# Patient Record
Sex: Male | Born: 1990 | Race: White | Hispanic: No | Marital: Single | Smoking: Never smoker
Health system: Southern US, Community
[De-identification: ages and names within clinical notes are randomized; demographics above are authoritative.]

## PROBLEM LIST (undated history)

## (undated) DIAGNOSIS — R45851 Suicidal ideations: Secondary | ICD-10-CM

## (undated) DIAGNOSIS — F329 Major depressive disorder, single episode, unspecified: Secondary | ICD-10-CM

## (undated) DIAGNOSIS — F32A Depression, unspecified: Secondary | ICD-10-CM

---
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
## 2013-09-09 ENCOUNTER — Emergency Department: Payer: Self-pay | Admitting: Emergency Medicine

## 2013-09-09 LAB — CBC
MCH: 31.2 pg (ref 26.0–34.0)
MCHC: 36 g/dL (ref 32.0–36.0)
MCV: 87 fL (ref 80–100)
Platelet: 191 10*3/uL (ref 150–440)

## 2013-09-09 LAB — URINALYSIS, COMPLETE
Bilirubin,UR: NEGATIVE
Leukocyte Esterase: NEGATIVE
Ph: 5 (ref 4.5–8.0)
Protein: NEGATIVE
WBC UR: 1 /HPF (ref 0–5)

## 2013-09-09 LAB — T4, FREE: Free Thyroxine: 1 ng/dL (ref 0.76–1.46)

## 2013-09-09 LAB — DRUG SCREEN, URINE
Barbiturates, Ur Screen: NEGATIVE (ref ?–200)
MDMA (Ecstasy)Ur Screen: NEGATIVE (ref ?–500)
Tricyclic, Ur Screen: NEGATIVE (ref ?–1000)

## 2013-09-09 LAB — COMPREHENSIVE METABOLIC PANEL
Chloride: 106 mmol/L (ref 98–107)
Creatinine: 1.12 mg/dL (ref 0.60–1.30)
EGFR (Non-African Amer.): >60
Glucose: 109 mg/dL — ABNORMAL HIGH (ref 65–99)
Osmolality: 274 (ref 275–301)

## 2013-09-09 LAB — TSH: Thyroid Stimulating Horm: 5.68 u[IU]/mL — ABNORMAL HIGH

## 2013-12-12 ENCOUNTER — Emergency Department: Payer: Self-pay | Admitting: Emergency Medicine

## 2013-12-12 LAB — URINALYSIS, COMPLETE
BILIRUBIN, UR: NEGATIVE
Bacteria: NONE SEEN
Blood: NEGATIVE
GLUCOSE, UR: NEGATIVE mg/dL (ref 0–75)
Ketone: NEGATIVE
LEUKOCYTE ESTERASE: NEGATIVE
Nitrite: NEGATIVE
PROTEIN: NEGATIVE
Ph: 6 (ref 4.5–8.0)
SPECIFIC GRAVITY: 1.024 (ref 1.003–1.030)
Squamous Epithelial: NONE SEEN
WBC UR: 1 /HPF (ref 0–5)

## 2013-12-12 LAB — COMPREHENSIVE METABOLIC PANEL
ALBUMIN: 3.9 g/dL (ref 3.4–5.0)
AST: 29 U/L (ref 15–37)
Alkaline Phosphatase: 103 U/L
Anion Gap: 5 — ABNORMAL LOW (ref 7–16)
BUN: 11 mg/dL (ref 7–18)
Bilirubin,Total: 0.4 mg/dL (ref 0.2–1.0)
CHLORIDE: 105 mmol/L (ref 98–107)
CO2: 29 mmol/L (ref 21–32)
CREATININE: 0.92 mg/dL (ref 0.60–1.30)
Calcium, Total: 9.1 mg/dL (ref 8.5–10.1)
EGFR (African American): >60
GLUCOSE: 117 mg/dL — AB (ref 65–99)
OSMOLALITY: 278 (ref 275–301)
POTASSIUM: 3.7 mmol/L (ref 3.5–5.1)
SGPT (ALT): 46 U/L (ref 12–78)
Sodium: 139 mmol/L (ref 136–145)
Total Protein: 7.3 g/dL (ref 6.4–8.2)

## 2013-12-12 LAB — DRUG SCREEN, URINE
AMPHETAMINES, UR SCREEN: NEGATIVE (ref ?–1000)
BARBITURATES, UR SCREEN: NEGATIVE (ref ?–200)
BENZODIAZEPINE, UR SCRN: NEGATIVE (ref ?–200)
CANNABINOID 50 NG, UR ~~LOC~~: NEGATIVE (ref ?–50)
COCAINE METABOLITE, UR ~~LOC~~: NEGATIVE (ref ?–300)
MDMA (Ecstasy)Ur Screen: NEGATIVE (ref ?–500)
METHADONE, UR SCREEN: NEGATIVE (ref ?–300)
Opiate, Ur Screen: NEGATIVE (ref ?–300)
Phencyclidine (PCP) Ur S: NEGATIVE (ref ?–25)
TRICYCLIC, UR SCREEN: NEGATIVE (ref ?–1000)

## 2013-12-12 LAB — ETHANOL: Ethanol %: <0.003 % (ref 0.000–0.080)

## 2013-12-12 LAB — CBC
HCT: 46.4 % (ref 40.0–52.0)
HGB: 16.4 g/dL (ref 13.0–18.0)
MCH: 30.7 pg (ref 26.0–34.0)
MCHC: 35.4 g/dL (ref 32.0–36.0)
MCV: 87 fL (ref 80–100)
Platelet: 183 10*3/uL (ref 150–440)
RBC: 5.34 10*6/uL (ref 4.40–5.90)
RDW: 13.6 % (ref 11.5–14.5)
WBC: 5.7 10*3/uL (ref 3.8–10.6)

## 2013-12-12 LAB — ACETAMINOPHEN LEVEL: Acetaminophen: <2 ug/mL

## 2013-12-12 LAB — SALICYLATE LEVEL: Salicylates, Serum: <1.7 mg/dL

## 2014-07-28 ENCOUNTER — Emergency Department: Payer: Self-pay

## 2014-07-28 LAB — COMPREHENSIVE METABOLIC PANEL
ALBUMIN: 4.2 g/dL (ref 3.4–5.0)
ALK PHOS: 128 U/L — AB
Anion Gap: 8 (ref 7–16)
BILIRUBIN TOTAL: 0.7 mg/dL (ref 0.2–1.0)
BUN: 10 mg/dL (ref 7–18)
CALCIUM: 9.3 mg/dL (ref 8.5–10.1)
CHLORIDE: 104 mmol/L (ref 98–107)
CO2: 26 mmol/L (ref 21–32)
CREATININE: 1.22 mg/dL (ref 0.60–1.30)
GLUCOSE: 94 mg/dL (ref 65–99)
OSMOLALITY: 274 (ref 275–301)
POTASSIUM: 4 mmol/L (ref 3.5–5.1)
SGOT(AST): 37 U/L (ref 15–37)
SGPT (ALT): 48 U/L
Sodium: 138 mmol/L (ref 136–145)
TOTAL PROTEIN: 8.4 g/dL — AB (ref 6.4–8.2)

## 2014-07-28 LAB — CBC
HCT: 53.9 % — AB (ref 40.0–52.0)
HGB: 18.5 g/dL — ABNORMAL HIGH (ref 13.0–18.0)
MCH: 30 pg (ref 26.0–34.0)
MCHC: 34.2 g/dL (ref 32.0–36.0)
MCV: 88 fL (ref 80–100)
Platelet: 284 10*3/uL (ref 150–440)
RBC: 6.14 10*6/uL — ABNORMAL HIGH (ref 4.40–5.90)
RDW: 13.8 % (ref 11.5–14.5)
WBC: 8.7 10*3/uL (ref 3.8–10.6)

## 2014-07-28 LAB — URINALYSIS, COMPLETE
BLOOD: NEGATIVE
Bacteria: NONE SEEN
Bilirubin,UR: NEGATIVE
GLUCOSE, UR: NEGATIVE mg/dL (ref 0–75)
Hyaline Cast: 6
KETONE: NEGATIVE
LEUKOCYTE ESTERASE: NEGATIVE
NITRITE: NEGATIVE
PH: 5 (ref 4.5–8.0)
Protein: NEGATIVE
RBC,UR: 1 /HPF (ref 0–5)
Specific Gravity: 1.017 (ref 1.003–1.030)

## 2014-07-28 LAB — DRUG SCREEN, URINE

## 2014-07-28 LAB — ACETAMINOPHEN LEVEL

## 2014-07-28 LAB — TSH: THYROID STIMULATING HORM: 4.09 u[IU]/mL

## 2014-07-28 LAB — ETHANOL: Ethanol: <3 mg/dL

## 2014-07-28 LAB — SALICYLATE LEVEL: Salicylates, Serum: <1.7 mg/dL

## 2014-08-15 ENCOUNTER — Emergency Department: Payer: Self-pay | Admitting: Emergency Medicine

## 2014-08-15 LAB — COMPREHENSIVE METABOLIC PANEL
ALK PHOS: 115 U/L
ALT: 55 U/L
Albumin: 4.1 g/dL (ref 3.4–5.0)
BUN: 14 mg/dL (ref 7–18)
Bilirubin,Total: 0.4 mg/dL (ref 0.2–1.0)
CALCIUM: 8.9 mg/dL (ref 8.5–10.1)
Chloride: 111 mmol/L — ABNORMAL HIGH (ref 98–107)
Co2: 27 mmol/L (ref 21–32)
Creatinine: 1.13 mg/dL (ref 0.60–1.30)
EGFR (African American): >60
GLUCOSE: 101 mg/dL — AB (ref 65–99)
Osmolality: 273 (ref 275–301)
POTASSIUM: 3.7 mmol/L (ref 3.5–5.1)
SGOT(AST): 31 U/L (ref 15–37)
Sodium: 136 mmol/L (ref 136–145)
TOTAL PROTEIN: 7.8 g/dL (ref 6.4–8.2)

## 2014-08-15 LAB — ETHANOL: Ethanol: <3 mg/dL

## 2014-08-15 LAB — URINALYSIS, COMPLETE
BACTERIA: NONE SEEN
Bilirubin,UR: NEGATIVE
Blood: NEGATIVE
GLUCOSE, UR: NEGATIVE mg/dL (ref 0–75)
Leukocyte Esterase: NEGATIVE
NITRITE: NEGATIVE
Ph: 6 (ref 4.5–8.0)
Protein: 30
Specific Gravity: 1.035 (ref 1.003–1.030)
Squamous Epithelial: NONE SEEN

## 2014-08-15 LAB — SALICYLATE LEVEL: Salicylates, Serum: <1.7 mg/dL

## 2014-08-15 LAB — ACETAMINOPHEN LEVEL: Acetaminophen: <2 ug/mL

## 2014-08-15 LAB — CBC
HCT: 47.6 % (ref 40.0–52.0)
HGB: 16.7 g/dL (ref 13.0–18.0)
MCH: 30.8 pg (ref 26.0–34.0)
MCHC: 35 g/dL (ref 32.0–36.0)
MCV: 88 fL (ref 80–100)
Platelet: 211 10*3/uL (ref 150–440)
RBC: 5.41 10*6/uL (ref 4.40–5.90)
RDW: 13.8 % (ref 11.5–14.5)
WBC: 6.1 10*3/uL (ref 3.8–10.6)

## 2014-08-15 LAB — DRUG SCREEN, URINE

## 2014-10-22 ENCOUNTER — Emergency Department: Payer: Self-pay | Admitting: Emergency Medicine

## 2014-10-22 LAB — CBC WITH DIFFERENTIAL/PLATELET
BASOS ABS: 0.1 10*3/uL (ref 0.0–0.1)
Basophil %: 0.6 %
EOS ABS: 0.2 10*3/uL (ref 0.0–0.7)
EOS PCT: 2.3 %
HCT: 54.4 % — ABNORMAL HIGH (ref 40.0–52.0)
HGB: 18.3 g/dL — AB (ref 13.0–18.0)
LYMPHS ABS: 2.3 10*3/uL (ref 1.0–3.6)
Lymphocyte %: 24.4 %
MCH: 29.9 pg (ref 26.0–34.0)
MCHC: 33.6 g/dL (ref 32.0–36.0)
MCV: 89 fL (ref 80–100)
MONOS PCT: 7.4 %
Monocyte #: 0.7 x10 3/mm (ref 0.2–1.0)
NEUTROS ABS: 6.1 10*3/uL (ref 1.4–6.5)
Neutrophil %: 65.3 %
Platelet: 233 10*3/uL (ref 150–440)
RBC: 6.1 10*6/uL — AB (ref 4.40–5.90)
RDW: 13.9 % (ref 11.5–14.5)
WBC: 9.3 10*3/uL (ref 3.8–10.6)

## 2014-10-23 LAB — COMPREHENSIVE METABOLIC PANEL
ALBUMIN: 4.5 g/dL (ref 3.4–5.0)
ALK PHOS: 129 U/L — AB
ANION GAP: 7 (ref 7–16)
AST: 24 U/L (ref 15–37)
BILIRUBIN TOTAL: 0.6 mg/dL (ref 0.2–1.0)
BUN: 7 mg/dL (ref 7–18)
CALCIUM: 9.3 mg/dL (ref 8.5–10.1)
Chloride: 104 mmol/L (ref 98–107)
Co2: 29 mmol/L (ref 21–32)
Creatinine: 1.09 mg/dL (ref 0.60–1.30)
EGFR (African American): >60
GLUCOSE: 89 mg/dL (ref 65–99)
OSMOLALITY: 277 (ref 275–301)
POTASSIUM: 3.6 mmol/L (ref 3.5–5.1)
SGPT (ALT): 68 U/L — ABNORMAL HIGH
Sodium: 140 mmol/L (ref 136–145)
Total Protein: 8 g/dL (ref 6.4–8.2)

## 2014-10-23 LAB — URINALYSIS, COMPLETE
BLOOD: NEGATIVE
Bacteria: NONE SEEN
Bilirubin,UR: NEGATIVE
Glucose,UR: NEGATIVE mg/dL (ref 0–75)
Ketone: NEGATIVE
Leukocyte Esterase: NEGATIVE
Nitrite: NEGATIVE
PH: 5 (ref 4.5–8.0)
Protein: 30
SPECIFIC GRAVITY: 1.028 (ref 1.003–1.030)
SQUAMOUS EPITHELIAL: NONE SEEN

## 2014-10-23 LAB — LIPASE, BLOOD: LIPASE: 106 U/L (ref 73–393)

## 2015-03-02 NOTE — Consult Note (Signed)
PATIENT NAME:  Roger Miranda, Roger Miranda MR#:  [id number] DATE OF BIRTH:  [birth year]  DATE OF CONSULTATION:  08/16/2014  REFERRING PHYSICIAN:   CONSULTING PHYSICIAN:  Audery AmelJohn T. Clapacs, MD  IDENTIFYING INFORMATION AND REASON FOR CONSULT: A 24 year old gentleman brought here for evaluation after making what was thought to be a suicidal statement outside the hospital.   CHIEF COMPLAINT: "I had a panic attack."   HISTORY OF PRESENT ILLNESS: Information obtained from the patient and the chart. The patient's history that he is giving now is consistent with what he reported last night. He says that yesterday afternoon or evening, he had a panic attack. He was out on the street near the homeless shelter. He had just gone to the shelter to try and get admitted for the night and was told that he was too late. He realized that he had no place else to stay and started to feel completely overwhelmed. He says that the worst of the panic attack lasted about 5 minutes. Heart was beating fast, felt short of breath, felt overwhelmingly frightened. During that time, he called 911. By the time they got there, he had calmed down, but they diagnosed him with a panic attack. He says he has been under a lot of stress for the last couple of weeks. He and his mother seem to constantly butt heads and it escalated to where she threw him out of the house. He has no place to stay and no place to work. Feels like he has no support. He denies that he is having any suicidal or homicidal ideation. Denies that he is having any hallucinations. Denies that he is abusing substances. Not currently getting any mental health treatment.   PAST PSYCHIATRIC HISTORY: The patient says he has been in foster care and group homes ever since he was a young child and has seen psychiatrists many times over the years. He says they tried many medicines on him when he was a child, including everything from Ritalin to antipsychotics, to antidepressants. To his recollection,  none of it was any help and he stopped it as soon as he could. Has not been on any medication in 3 to 4 years. Has not been getting any mental health treatment since then either. He did go to a hospital psychiatrically, probably a little over a year ago. From the way he describes it, it sounds like [facility]. He either truly cannot remember it or just does not want to discuss it because he says he cannot remember why he was there and that he did not take any medicine. He denies history of suicide attempts. It does sound like he gets agitated and violent and has gotten in fights with people.   SUBSTANCE ABUSE HISTORY: Denies any drug abuse. Does say that he drinks occasionally, but he says it has never been a significant problem for him, was not drinking yesterday.   FAMILY HISTORY: He has a grandmother, whom he charitably describes as "insane."   SOCIAL HISTORY: The patient had been staying with his mother, but something in their relationship escalated to where she threw him out of the house. He has no place to live. He had not been to the shelter before and was going to try and check in and discovered he was too late which made him panic. No job. Did finish high school. He is trying to find work. Does not feel like he has any support.   CURRENT MEDICATIONS: None.  ALLERGIES: No known drug allergies.   REVIEW OF SYSTEMS: Denies any physical symptoms. No malaise and no pain. No fever. No weakness, no sleep problems. Denies suicidal or homicidal ideation. Full 9 category review of systems is negative.   MENTAL STATUS EXAMINATION: Reasonably well-groomed young man, who looks his stated age, cooperative with the interview. Eye contact good. Psychomotor activity normal. Speech normal rate, tone and volume. Affect is mildly anxious, but reactive and appropriate. Mood is stated as okay. Thoughts are lucid without loosening of associations. No sign of delusions. Denies auditory or visual  hallucinations. Denies suicidal or homicidal ideation. Can recall 3/3 objects immediately and at 3 minutes. Long-term memory intact. Judgment and insight adequate. Baseline intelligence and fund of knowledge appears normal.   LABORATORY RESULTS: Chemistry panel: Nothing remarkable. Drug screen is all negative. Alcohol level negative. CBC: Completely normal. Urinalysis: Normal.   PHYSICAL EXAMINATION:  VITAL SIGNS: Blood pressure 148/80, respirations 20, pulse 94, temperature 97.8.  The patient is ambulatory. Moves all extremities. Does not appear in any physical distress.   ASSESSMENT: A 24 year old man with an acute panic attack. It sounds like he may have had a lot of behavioral problems as a child, but as an adult, it is not clear from his history that he has any ongoing specific mental health diagnosis. Can be given a diagnosis of adjustment disorder with acute anxiety, but needs further evaluation. Does not require hospital level of treatment.   TREATMENT PLAN: The patient can be discharged from the Emergency Room. No medication will be started. He will be given a referral to [facility]. Supportive and educational counseling conducted about the utility of going for follow-up treatment and he is agreeable to the plan.   DIAGNOSIS, PRINCIPAL AND PRIMARY:  AXIS I: Adjustment disorder with anxiety.   SECONDARY DIAGNOSES:  AXIS I: No further.  AXIS II: Deferred.  AXIS III: No diagnosis.   ____________________________ Audery Amel, MD jtc:JT D: 08/16/2014 11:37:22 ET T: 08/16/2014 11:52:11 ET JOB#: [id number]  cc: Audery Amel, MD, <Dictator> Audery Amel MD ELECTRONICALLY SIGNED 08/20/2014 0:22

## 2015-04-09 ENCOUNTER — Encounter: Payer: Self-pay | Admitting: Urgent Care

## 2015-04-09 ENCOUNTER — Emergency Department
Admission: EM | Admit: 2015-04-09 | Discharge: 2015-04-10 | Disposition: A | Discharge status: Home / Self Care | Payer: Self-pay | Attending: Emergency Medicine | Admitting: Emergency Medicine

## 2015-04-09 DIAGNOSIS — F329 Major depressive disorder, single episode, unspecified: Secondary | ICD-10-CM | POA: Insufficient documentation

## 2015-04-09 DIAGNOSIS — F32A Depression, unspecified: Secondary | ICD-10-CM

## 2015-04-09 DIAGNOSIS — R45851 Suicidal ideations: Secondary | ICD-10-CM

## 2015-04-09 HISTORY — DX: Depression, unspecified: F32.A

## 2015-04-09 HISTORY — DX: Major depressive disorder, single episode, unspecified: F32.9

## 2015-04-09 LAB — URINALYSIS COMPLETE WITH MICROSCOPIC (ARMC ONLY)
BACTERIA UA: NONE SEEN
Bilirubin Urine: NEGATIVE
GLUCOSE, UA: NEGATIVE mg/dL
Hgb urine dipstick: NEGATIVE
Leukocytes, UA: NEGATIVE
Nitrite: NEGATIVE
Protein, ur: NEGATIVE mg/dL
SQUAMOUS EPITHELIAL / LPF: NONE SEEN
Specific Gravity, Urine: 1.023 (ref 1.005–1.030)
pH: 6 (ref 5.0–8.0)

## 2015-04-09 LAB — URINE DRUG SCREEN, QUALITATIVE (ARMC ONLY)
AMPHETAMINES, UR SCREEN: NOT DETECTED
Barbiturates, Ur Screen: NOT DETECTED
Benzodiazepine, Ur Scrn: NOT DETECTED
Cannabinoid 50 Ng, Ur ~~LOC~~: NOT DETECTED
Cocaine Metabolite,Ur ~~LOC~~: NOT DETECTED
MDMA (ECSTASY) UR SCREEN: NOT DETECTED
Methadone Scn, Ur: NOT DETECTED
Opiate, Ur Screen: NOT DETECTED
Phencyclidine (PCP) Ur S: NOT DETECTED
TRICYCLIC, UR SCREEN: NOT DETECTED

## 2015-04-09 LAB — COMPREHENSIVE METABOLIC PANEL
ALBUMIN: 4.4 g/dL (ref 3.5–5.0)
ALT: 34 U/L (ref 17–63)
AST: 35 U/L (ref 15–41)
Alkaline Phosphatase: 93 U/L (ref 38–126)
Anion gap: 9 (ref 5–15)
BUN: 14 mg/dL (ref 6–20)
CO2: 25 mmol/L (ref 22–32)
Calcium: 9.4 mg/dL (ref 8.9–10.3)
Chloride: 105 mmol/L (ref 101–111)
Creatinine, Ser: 1.09 mg/dL (ref 0.61–1.24)
GFR calc non Af Amer: >60 mL/min (ref >60)
Glucose, Bld: 88 mg/dL (ref 65–99)
POTASSIUM: 3.9 mmol/L (ref 3.5–5.1)
Sodium: 139 mmol/L (ref 135–145)
Total Bilirubin: 0.7 mg/dL (ref 0.3–1.2)
Total Protein: 7.5 g/dL (ref 6.5–8.1)

## 2015-04-09 LAB — CBC
HEMATOCRIT: 46.3 % (ref 40.0–52.0)
HEMOGLOBIN: 16.4 g/dL (ref 13.0–18.0)
MCH: 30.6 pg (ref 26.0–34.0)
MCHC: 35.3 g/dL (ref 32.0–36.0)
MCV: 86.6 fL (ref 80.0–100.0)
Platelets: 186 10*3/uL (ref 150–440)
RBC: 5.34 MIL/uL (ref 4.40–5.90)
RDW: 13.3 % (ref 11.5–14.5)
WBC: 5.3 10*3/uL (ref 3.8–10.6)

## 2015-04-09 LAB — ETHANOL: Alcohol, Ethyl (B): <5 mg/dL (ref <5)

## 2015-04-09 LAB — ACETAMINOPHEN LEVEL

## 2015-04-09 LAB — SALICYLATE LEVEL

## 2015-04-09 NOTE — ED Notes (Signed)
BEHAVIORAL HEALTH ROUNDING Patient sleeping: No. Patient alert and oriented: yes Behavior appropriate: Yes.  ; If no, describe:  Nutrition and fluids offered: Yes  Toileting and hygiene offered: Yes  Sitter present: yes Law enforcement present: Yes  

## 2015-04-09 NOTE — ED Notes (Signed)
Patient presents in custody of [employer]; VOLUNTARY at this time. Patient reports severe depression and SI; "its the same old trouble with my family. They push me to the point where I want to do something stupid." Patient reports that he text a "law firm" yesterday expressing SI citing that "no amount of money would fix it. It would be better if I just died." Patient denies SI and HI at this time.

## 2015-04-09 NOTE — ED Notes (Signed)

## 2015-04-10 DIAGNOSIS — F339 Major depressive disorder, recurrent, unspecified: Secondary | ICD-10-CM

## 2015-04-10 NOTE — ED Notes (Signed)

## 2015-04-10 NOTE — ED Notes (Signed)
Pt's mother called Mrs. Alona BeneJoyce Depolo to speak to pt. Dr. Manson PasseyBrown talked to pt's mother over the phone with pt's consent. Dr. Manson PasseyBrown provided information about the plan of care.

## 2015-04-10 NOTE — ED Notes (Signed)
No am meds ordered at this time  

## 2015-04-10 NOTE — ED Notes (Signed)

## 2015-04-10 NOTE — ED Notes (Signed)
BEHAVIORAL HEALTH ROUNDING Patient sleeping: No. Patient alert and oriented: yes Behavior appropriate: Yes.  ; If no, describe:  Nutrition and fluids offered: yes Toileting and hygiene offered: Yes  Sitter present: q15 minute observations and security camera monitoring Law enforcement present: Yes  ODS  

## 2015-04-10 NOTE — Consult Note (Signed)
[facility] Face-to-Face Psychiatry Consult   Reason for Consult:  Consult for this 24 year old man with history of recurrent depression who made alleging suicidal statements Referring Physician:  gayle Patient Identification: Roger Miranda MRN:  [id number] Principal Diagnosis: <principal problem not specified> Diagnosis:  There are no active problems to display for this patient.   Total Time spent with patient: 1 hour  Subjective:   Roger Miranda is a 24 y.o. male patient admitted with "2 days ago I was not feeling good" area patient admits that he posted some suicidal statements online when he was upset. He denies that he was actually having any intention or plan of killing himself.Marland Kitchen  HPI:  Information from the patient and the chart. Patient admits that a couple days ago he was online and he posted some statements about killing himself. He says he was feeling very bad and disappointed in his life and down on himself at the time. He denies that he actually had any intention of harming himself and denies that he had any plan to harm himself. Mood is intermittently depressed. Doesn't feel constantly down but is easily brought down into a negative space by thinking about his problems in his life. Has chronic poor sleep. Appetite normal. He's not currently getting any outpatient psychiatric treatment. He has been able to find work and is frustrated with his inability to make any progress in life. Denies any psychotic symptoms. Denies any substance abuse. Had been arguing with his uncle recently which may have been a stress that made him feel even worse about himself.  Past psychiatric history goes back to childhood. Multiple psychiatric medications have been tried at various times including stimulants and mood stabilizers and antidepressives. He hasn't taken medicines in years any consistent way. Unclear about whether he was ever of any benefit. He has been admitted to inpatient hospitals twice in the past. I have  seen him at least once previously when he had threatened suicide but had not required hospitalization.  Social history: Difficult life growing up. Has been homeless and separated from his family for long periods of time. Moved around the country staying at homeless shelters. Currently living with his mother but it sounds like it's not a very stable situation.  Medical history: Overweight. No other significant ongoing medical problems  Substance abuse history: Drinks rarely. Denies any abuse of drugs.  Current medications none HPI Elements:   Quality:  Suicidal threats. Severity:  Moderate. Timing:  A couple days ago when he was feeling depressed although thoughts are intermittent. Duration:  Transient. Context:  Homelessness lack of work conflict with family.  Past Medical History:  Past Medical History  Diagnosis Date  . Depression    History reviewed. No pertinent past surgical history. Family History: No family history on file. Social History:  History  Alcohol Use  . Yes     History  Drug Use No    History   Social History  . Marital Status: Single    Spouse Name: N/A  . Number of Children: N/A  . Years of Education: N/A   Social History Main Topics  . Smoking status: Never Smoker   . Smokeless tobacco: Not on file  . Alcohol Use: Yes  . Drug Use: No  . Sexual Activity: Not on file   Other Topics Concern  . None   Social History Narrative  . None   Additional Social History:    History of alcohol / drug use?: No history of alcohol /  drug abuse                     Allergies:  No Known Allergies  Labs:  Results for orders placed or performed during the hospital encounter of 04/09/15 (from the past 48 hour(s))  Acetaminophen level     Status: Abnormal   Collection Time: 04/09/15 10:25 PM  Result Value Ref Range   Acetaminophen (Tylenol), Serum <10 (L) 10 - 30 ug/mL    Comment:        THERAPEUTIC CONCENTRATIONS VARY SIGNIFICANTLY. A RANGE OF  10-30 ug/mL MAY BE AN EFFECTIVE CONCENTRATION FOR MANY PATIENTS. HOWEVER, SOME ARE BEST TREATED AT CONCENTRATIONS OUTSIDE THIS RANGE. ACETAMINOPHEN CONCENTRATIONS >150 ug/mL AT 4 HOURS AFTER INGESTION AND >50 ug/mL AT 12 HOURS AFTER INGESTION ARE OFTEN ASSOCIATED WITH TOXIC REACTIONS.   CBC     Status: None   Collection Time: 04/09/15 10:25 PM  Result Value Ref Range   WBC 5.3 3.8 - 10.6 K/uL   RBC 5.34 4.40 - 5.90 MIL/uL   Hemoglobin 16.4 13.0 - 18.0 g/dL   HCT 46.3 40.0 - 52.0 %   MCV 86.6 80.0 - 100.0 fL   MCH 30.6 26.0 - 34.0 pg   MCHC 35.3 32.0 - 36.0 g/dL   RDW 13.3 11.5 - 14.5 %   Platelets 186 150 - 440 K/uL  Comprehensive metabolic panel     Status: None   Collection Time: 04/09/15 10:25 PM  Result Value Ref Range   Sodium 139 135 - 145 mmol/L   Potassium 3.9 3.5 - 5.1 mmol/L   Chloride 105 101 - 111 mmol/L   CO2 25 22 - 32 mmol/L   Glucose, Bld 88 65 - 99 mg/dL   BUN 14 6 - 20 mg/dL   Creatinine, Ser 1.09 0.61 - 1.24 mg/dL   Calcium 9.4 8.9 - 10.3 mg/dL   Total Protein 7.5 6.5 - 8.1 g/dL   Albumin 4.4 3.5 - 5.0 g/dL   AST 35 15 - 41 U/L   ALT 34 17 - 63 U/L   Alkaline Phosphatase 93 38 - 126 U/L   Total Bilirubin 0.7 0.3 - 1.2 mg/dL   GFR calc non Af Amer >60 >60 mL/min   GFR calc Af Amer >60 >60 mL/min    Comment: (NOTE) The eGFR has been calculated using the CKD EPI equation. This calculation has not been validated in all clinical situations. eGFR's persistently <60 mL/min signify possible Chronic Kidney Disease.    Anion gap 9 5 - 15  Ethanol (ETOH)     Status: None   Collection Time: 04/09/15 10:25 PM  Result Value Ref Range   Alcohol, Ethyl (B) <5 <5 mg/dL    Comment:        LOWEST DETECTABLE LIMIT FOR SERUM ALCOHOL IS 11 mg/dL FOR MEDICAL PURPOSES ONLY   Salicylate level     Status: None   Collection Time: 04/09/15 10:25 PM  Result Value Ref Range   Salicylate Lvl <8.1 2.8 - 30.0 mg/dL  Urine Drug Screen, Qualitative [facility])     Status:  None   Collection Time: 04/09/15 10:25 PM  Result Value Ref Range   Tricyclic, Ur Screen NONE DETECTED NONE DETECTED   Amphetamines, Ur Screen NONE DETECTED NONE DETECTED   MDMA (Ecstasy)Ur Screen NONE DETECTED NONE DETECTED   Cocaine Metabolite,Ur [place] NONE DETECTED NONE DETECTED   Opiate, Ur Screen NONE DETECTED NONE DETECTED   Phencyclidine (PCP) Ur S NONE DETECTED NONE DETECTED  Cannabinoid 50 Ng, Ur [place] NONE DETECTED NONE DETECTED   Barbiturates, Ur Screen NONE DETECTED NONE DETECTED   Benzodiazepine, Ur Scrn NONE DETECTED NONE DETECTED   Methadone Scn, Ur NONE DETECTED NONE DETECTED    Comment: (NOTE) 379  Tricyclics, urine               Cutoff 1000 ng/mL 200  Amphetamines, urine             Cutoff 1000 ng/mL 300  MDMA (Ecstasy), urine           Cutoff 500 ng/mL 400  Cocaine Metabolite, urine       Cutoff 300 ng/mL 500  Opiate, urine                   Cutoff 300 ng/mL 600  Phencyclidine (PCP), urine      Cutoff 25 ng/mL 700  Cannabinoid, urine              Cutoff 50 ng/mL 800  Barbiturates, urine             Cutoff 200 ng/mL 900  Benzodiazepine, urine           Cutoff 200 ng/mL 1000 Methadone, urine                Cutoff 300 ng/mL 1100 1200 The urine drug screen provides only a preliminary, unconfirmed 1300 analytical test result and should not be used for non-medical 1400 purposes. Clinical consideration and professional judgment should 1500 be applied to any positive drug screen result due to possible 1600 interfering substances. A more specific alternate chemical method 1700 must be used in order to obtain a confirmed analytical result.  1800 Gas chromato graphy / mass spectrometry (GC/MS) is the preferred 1900 confirmatory method.   Urinalysis complete, with microscopic [facility])     Status: Abnormal   Collection Time: 04/09/15 10:25 PM  Result Value Ref Range   Color, Urine YELLOW (A) YELLOW   APPearance CLEAR (A) CLEAR   Glucose, UA NEGATIVE NEGATIVE mg/dL   Bilirubin  Urine NEGATIVE NEGATIVE   Ketones, ur 2+ (A) NEGATIVE mg/dL   Specific Gravity, Urine 1.023 1.005 - 1.030   Hgb urine dipstick NEGATIVE NEGATIVE   pH 6.0 5.0 - 8.0   Protein, ur NEGATIVE NEGATIVE mg/dL   Nitrite NEGATIVE NEGATIVE   Leukocytes, UA NEGATIVE NEGATIVE   RBC / HPF 0-5 0 - 5 RBC/hpf   WBC, UA 0-5 0 - 5 WBC/hpf   Bacteria, UA NONE SEEN NONE SEEN   Squamous Epithelial / LPF NONE SEEN NONE SEEN   Mucous PRESENT     Vitals: Blood pressure 132/88, pulse 93, temperature 98.6 F (37 C), temperature source Oral, resp. rate 18, height 5' 11"  (1.803 m), weight 127.007 kg (280 lb), SpO2 99 %.  Risk to Self: Suicidal Ideation: No-Not Currently/Within Last 6 Months Suicidal Intent: No Is patient at risk for suicide?: Yes Suicidal Plan?: No Access to Means:  (Unknown) What has been your use of drugs/alcohol within the last 12 months?: None How many times?: 0 Other Self Harm Risks: None reported Triggers for Past Attempts: None known Intentional Self Injurious Behavior: None Risk to Others: Homicidal Ideation: No Thoughts of Harm to Others: No Current Homicidal Intent: No Current Homicidal Plan: No Access to Homicidal Means: No Identified Victim: None reported History of harm to others?: No Assessment of Violence: On admission Violent Behavior Description: Denied Does patient have access to weapons?: No Criminal Charges Pending?: No  Does patient have a court date: No Prior Inpatient Therapy:   Prior Outpatient Therapy:    No current facility-administered medications for this encounter.   No current outpatient prescriptions on file.    Musculoskeletal: Strength & Muscle Tone: within normal limits Gait & Station: normal Patient leans: N/A  Psychiatric Specialty Exam: Physical Exam  Constitutional: He appears well-developed and well-nourished.  HENT:  Head: Normocephalic and atraumatic.  Eyes: Conjunctivae are normal. Pupils are equal, round, and reactive to light.   Neck: Normal range of motion.  Cardiovascular: Normal heart sounds.   Respiratory: Effort normal.  GI: Soft.  Musculoskeletal: Normal range of motion.  Neurological: He is alert.  Skin: Skin is warm and dry.  Psychiatric: His speech is normal and behavior is normal. Thought content normal. His mood appears anxious. Cognition and memory are normal. He expresses impulsivity.    Review of Systems  Constitutional: Negative.   HENT: Negative.   Eyes: Negative.   Respiratory: Negative.   Cardiovascular: Negative.   Gastrointestinal: Negative.   Musculoskeletal: Negative.   Skin: Negative.   Neurological: Negative.   Psychiatric/Behavioral: Positive for depression. Negative for suicidal ideas, hallucinations, memory loss and substance abuse. The patient is nervous/anxious and has insomnia.     Blood pressure 132/88, pulse 93, temperature 98.6 F (37 C), temperature source Oral, resp. rate 18, height 5' 11"  (1.803 m), weight 127.007 kg (280 lb), SpO2 99 %.Body mass index is 39.07 kg/(m^2).  General Appearance: Casual and Fairly Groomed  Engineer, water::  Good  Speech:  Clear and Coherent  Volume:  Normal  Mood:  Anxious  Affect:  Appropriate  Thought Process:  Negative  Orientation:  Full (Time, Place, and Person)  Thought Content:  Negative  Suicidal Thoughts:  No  Homicidal Thoughts:  No  Memory:  Immediate;   Good Recent;   Good Remote;   Good  Judgement:  Fair  Insight:  Fair  Psychomotor Activity:  Normal  Concentration:  Fair  Recall:  [place] of Knowledge:Good  Language: Good  Akathisia:  No  Handed:  Right  AIMS (if indicated):     Assets:  Communication Skills Desire for Improvement Physical Health Resilience  ADL's:  Intact  Cognition: WNL  Sleep:      Medical Decision Making: Review of Psycho-Social Stressors (1), Review or order clinical lab tests (1), Established Problem, Worsening (2), Review of Last Therapy Session (1) and Review of Medication Regimen &  Side Effects (2)  Treatment Plan Summary: Plan Patient currently denies any suicidal ideation. His affect is appropriate and upbeat. He is able to discuss positive things in his life and has a positive plan for the future. At this point he no longer appears to be an acute suicide risk and no longer requires inpatient hospital treatment. Patient and I discussed his need for outpatient psychiatric treatment. He will be referred to [facility] and encouraged to follow up there and get an evaluation for possible therapy or medication management. Patient agrees to the plan. Case discussed with emergency room doctor. Patient can be released from IVC and released from the emergency room at the discretion of the ER physician.  Plan:  No evidence of imminent risk to self or others at present.   Patient does not meet criteria for psychiatric inpatient admission. Discussed crisis plan, support from social network, calling 911, coming to the Emergency Department, and calling Suicide Hotline. Disposition: Release as noted above  Alethia Berthold 04/10/2015 4:21 PM

## 2015-04-10 NOTE — ED Provider Notes (Signed)
-----------------------------------------   10:12 AM on 04/10/2015 -----------------------------------------   BP 132/88 mmHg  Pulse 86  Temp(Src) 98.9 F (37.2 C) (Oral)  Resp 18  Ht 5\' 11"  (1.803 m)  Wt 280 lb (127.007 kg)  BMI 39.07 kg/m2  SpO2 99%  The patient had no acute events since last update.  Calm and cooperative at this time.  Disposition is pending per Psychiatry/Behavioral Medicine team recommendations.    Gayla DossEryka A Naisha Wisdom, MD 04/10/15 1012

## 2015-04-10 NOTE — ED Notes (Signed)

## 2015-04-10 NOTE — ED Notes (Signed)
Patient observed lying in bed with eyes closed  Even, unlabored respirations observed   NAD pt appears to be sleeping  I will continue to monitor along with every 15 minute visual observations and ongoing security camera monitoring    

## 2015-04-10 NOTE — ED Notes (Signed)
BEHAVIORAL HEALTH ROUNDING Patient sleeping: Yes.   Patient alert and oriented: eyes closed  Appears asleep Behavior appropriate: Yes.  ; If no, describe:  Nutrition and fluids offered: Yes  Toileting and hygiene offered: sleeping Sitter present: q 15 minute observations and security camera monitoring Law enforcement present: yes  ODS 

## 2015-04-10 NOTE — ED Notes (Signed)
Pt laying in bed.  

## 2015-04-10 NOTE — ED Notes (Signed)

## 2015-04-10 NOTE — ED Notes (Signed)
Supper provided  Pt observed with no unusual behavior  Appropriate to stimulation  No verbalized needs or concerns at this time  NAD assessed  Continue to monitor 

## 2015-04-10 NOTE — ED Notes (Signed)
Pt calm and cooperative at this time with no complaints noted, will continue to monitor, pending psych consult.

## 2015-04-10 NOTE — ED Notes (Addendum)
Pt is currently in the shower  Pt states  "I will brush my teeth when I get home cause I am not even supposed to be here."   Psych consult pending  Plan of care discussed   Appropriate to stimulation  No verbalized needs or concerns at this time  NAD assessed  Continue to monitor

## 2015-04-10 NOTE — ED Notes (Signed)
Breakfast provided   Patient observed lying in bed with eyes closed  Even, unlabored respirations observed   NAD pt appears to be sleeping  I will continue to monitor along with every 15 minute visual observations and ongoing security camera monitoring    

## 2015-04-10 NOTE — ED Provider Notes (Signed)
-----------------------------------------   4:13 PM on 04/10/2015 -----------------------------------------  Vital signs stable and unremarkable. Patient calm and cooperative. Discussed with Dr. Toni Amendlapacs from psychiatry in the ED after his evaluation. The patient is currently medically and psychiatrically stable. We will discharge him home and have him follow-up with [facility] for continued evaluation of his depression.  Clinical impression is depression  Sharman CheekPhillip Beaumont Austad, MD 04/10/15 [phone]

## 2015-04-10 NOTE — ED Notes (Signed)

## 2015-04-10 NOTE — ED Notes (Signed)

## 2015-04-10 NOTE — ED Notes (Signed)
Pt report given to Raquel D RN. Pt transferred to ED BHU without incident accompanied by this RN.
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
## 2015-04-10 NOTE — ED Notes (Signed)
[facility]. Placed in room and oriented to unit. Pt. informed that for their safety all care areas are designed for safety and monitored by security cameras at all times; and visiting hours explained to patient. Patient verbalizes understanding, and verbal contract for safety obtained.

## 2015-04-10 NOTE — ED Notes (Signed)
Patient observed with no unusual behavior or acute distress. Patient with no verbalized needs or c/o at this time.... will continue to monitor and follow up as needed. Security staff monitoring patient on [employer] system.  

## 2015-04-10 NOTE — ED Notes (Signed)
1/1 bags of belongings returned to the pt at this time   Discharge instructions reviewed with him and he verbalized agreement and understanding  [facility]

## 2015-04-10 NOTE — ED Notes (Signed)
Pt laying in bed no change in condition will continue to monitor  

## 2015-04-10 NOTE — Discharge Instructions (Signed)
Depression °Depression refers to feeling sad, low, down in the dumps, blue, gloomy, or empty. In general, there are two kinds of depression: °· Normal sadness or normal grief. This kind of depression is one that we all feel from time to time after upsetting life experiences, such as the loss of a job or the ending of a relationship. This kind of depression is considered normal, is short lived, and resolves within a few days to 2 weeks. Depression experienced after the loss of a loved one (bereavement) often lasts longer than 2 weeks but normally gets better with time. °· Clinical depression. This kind of depression lasts longer than normal sadness or normal grief or interferes with your ability to function at home, at work, and in school. It also interferes with your personal relationships. It affects almost every aspect of your life. Clinical depression is an illness. °Symptoms of depression can also be caused by conditions other than those mentioned above, such as: °· Physical illness. Some physical illnesses, including underactive thyroid gland (hypothyroidism), severe anemia, specific types of cancer, diabetes, uncontrolled seizures, heart and lung problems, strokes, and chronic pain are commonly associated with symptoms of depression. °· Side effects of some prescription medicine. In some people, certain types of medicine can cause symptoms of depression. °· Substance abuse. Abuse of alcohol and illicit drugs can cause symptoms of depression. °SYMPTOMS °Symptoms of normal sadness and normal grief include the following: °· Feeling sad or crying for short periods of time. °· Not caring about anything (apathy). °· Difficulty sleeping or sleeping too much. °· No longer able to enjoy the things you used to enjoy. °· Desire to be by oneself all the time (social isolation). °· Lack of energy or motivation. °· Difficulty concentrating or remembering. °· Change in appetite or weight. °· Restlessness or  agitation. °Symptoms of clinical depression include the same symptoms of normal sadness or normal grief and also the following symptoms: °· Feeling sad or crying all the time. °· Feelings of guilt or worthlessness. °· Feelings of hopelessness or helplessness. °· Thoughts of suicide or the desire to harm yourself (suicidal ideation). °· Loss of touch with reality (psychotic symptoms). Seeing or hearing things that are not real (hallucinations) or having false beliefs about your life or the people around you (delusions and paranoia). °DIAGNOSIS  °The diagnosis of clinical depression is usually based on how bad the symptoms are and how long they have lasted. Your health care provider will also ask you questions about your medical history and substance use to find out if physical illness, use of prescription medicine, or substance abuse is causing your depression. Your health care provider may also order blood tests. °TREATMENT  °Often, normal sadness and normal grief do not require treatment. However, sometimes antidepressant medicine is given for bereavement to ease the depressive symptoms until they resolve. °The treatment for clinical depression depends on how bad the symptoms are but often includes antidepressant medicine, counseling with a mental health professional, or both. Your health care provider will help to determine what treatment is best for you. °Depression caused by physical illness usually goes away with appropriate medical treatment of the illness. If prescription medicine is causing depression, talk with your health care provider about stopping the medicine, decreasing the dose, or changing to another medicine. °Depression caused by the abuse of alcohol or illicit drugs goes away when you stop using these substances. Some adults need professional help in order to stop drinking or using drugs. °SEEK IMMEDIATE MEDICAL   CARE IF: °· You have thoughts about hurting yourself or others. °· You lose touch  with reality (have psychotic symptoms). °· You are taking medicine for depression and have a serious side effect. °FOR MORE INFORMATION °· National Alliance on Mental Illness: www.nami.org  °· National Institute of Mental Health: www.nimh.nih.gov  °Document Released: 10/23/2000 Document Revised: 03/12/2014 Document Reviewed: 01/25/2012 °ExitCare® Patient Information ©2015 [employer]. This information is not intended to replace advice given to you by your health care provider. Make sure you discuss any questions you have with your health care provider. ° °Suicidal Feelings, How to Help Yourself °Everyone feels sad or unhappy at times, but depressing thoughts and feelings of hopelessness can lead to thoughts of suicide. It can seem as if life is too tough to handle. If you feel as though you have reached the point where suicide is the only answer, it is time to let someone know immediately.  °HOW TO COPE AND PREVENT SUICIDE °· Let family, friends, teachers, or counselors know. Get help. Try not to isolate yourself from those who care about you. Even though you may not feel sociable, talk with someone every day. It is best if it is face-to-face. Remember, they will want to help you. °· Eat a regularly spaced and well-balanced diet. °· Get plenty of rest. °· Avoid alcohol and drugs because they will only make you feel worse and may also lower your inhibitions. Remove them from the home. If you are thinking of taking an overdose of your prescribed medicines, give your medicines to someone who can give them to you one day at a time. If you are on antidepressants, let your caregiver know of your feelings so he or she can provide a safer medicine, if that is a concern. °· Remove weapons or poisons from your home. °· Try to stick to routines. Follow a schedule and remind yourself that you have to keep that schedule every day. °· Set some realistic goals and achieve them. Make a list and cross things off as you go.  Accomplishments give a sense of worth. Wait until you are feeling better before doing things you find difficult or unpleasant to do. °· If you are able, try to start exercising. Even half-hour periods of exercise each day will make you feel better. Getting out in the sun or into nature helps you recover from depression faster. If you have a favorite place to walk, take advantage of that. °· Increase safe activities that have always given you pleasure. This may include playing your favorite music, reading a good book, painting a picture, or playing your favorite instrument. Do whatever takes your mind off your depression. °· Keep your living space well-lighted. °GET HELP °Contact a suicide hotline, crisis center, or local suicide prevention center for help right away. Local centers may include a hospital, clinic, community service organization, social service provider, or health department. °· Call your local emergency services (911 in the [place]). °· Call a suicide hotline: °¨ 1-800-273-TALK (1-800-273-8255) in the [place]. °¨ 1-800-SUICIDE (1-800-784-2433) in the [place]. °¨ 1-888-628-9454 in the [place] for Spanish-speaking counselors. °¨ 1-800-799-4TTY (1-800-799-4889) in the [place] for TTY users. °· Visit the following websites for information and help: °¨ National Suicide Prevention Lifeline: www.suicidepreventionlifeline.org °¨ Hopeline: www.hopeline.com °¨ American Foundation for Suicide Prevention: www.afsp.org °· For lesbian, gay, bisexual, transgender, or questioning youth, contact The [employer]: °¨ 1-866-4-U-TREVOR (1-866-488-7386) in the [place]. °¨ www.thetrevorproject.org °· In [place], treatment resources are listed in each   province with listings available under [employer] or similar titles. Another source for Crisis Centres by Province is located at  http://www.suicideprevention.ca/in-crisis-now/find-a-crisis-centre-now/crisis-centres °Document Released: 05/02/2003 Document Revised: 01/18/2012 Document Reviewed: 02/20/2014 °ExitCare® Patient Information ©2015 [employer]. This information is not intended to replace advice given to you by your health care provider. Make sure you discuss any questions you have with your health care provider. ° °

## 2015-04-10 NOTE — ED Notes (Signed)
Lunch provided   Patient observed lying in bed with eyes closed  Even, unlabored respirations observed   NAD pt appears to be sleeping  I will continue to monitor along with every 15 minute visual observations and ongoing security camera monitoring

## 2015-04-10 NOTE — ED Provider Notes (Signed)
[facility] Emergency Department Provider Note  ____________________________________________  Time seen: 11:45 PM  I have reviewed the triage vital signs and the nursing notes.   HISTORY  Chief Complaint Depression; Suicidal; and Psychiatric Evaluation     HPI Roger Miranda is a 24 y.o. male presents with thin police custody. Patient admits to severe depression and suicidal ideation yesterday. Patient apparently per history pulses on Facebook a prior intent to kill himself. Patient currently admits to feeling incredibly depressed due to his inability to have a job.    Past Medical History  Diagnosis Date  . Depression     There are no active problems to display for this patient.   History reviewed. No pertinent past surgical history.  No current outpatient prescriptions on file.  Allergies Review of patient's allergies indicates no known allergies.  No family history on file.  Social History History  Substance Use Topics  . Smoking status: Never Smoker   . Smokeless tobacco: Not on file  . Alcohol Use: Yes    Review of Systems  Constitutional: Negative for fever. Eyes: Negative for visual changes. ENT: Negative for sore throat. Cardiovascular: Negative for chest pain. Respiratory: Negative for shortness of breath. Gastrointestinal: Negative for abdominal pain, vomiting and diarrhea. Genitourinary: Negative for dysuria. Musculoskeletal: Negative for back pain. Skin: Negative for rash. Neurological: Negative for headaches, focal weakness or numbness. Psychiatric: Positive for depression and suicidal ideation  10-point ROS otherwise negative.  ____________________________________________   PHYSICAL EXAM:  VITAL SIGNS: ED Triage Vitals  Enc Vitals Group     BP 04/09/15 2218 132/88 mmHg     Pulse Rate 04/09/15 2218 86     Resp 04/09/15 2218 18     Temp 04/09/15 2218 98.9 F (37.2 C)     Temp Source 04/09/15 2218 Oral     SpO2  04/09/15 2218 99 %     Weight 04/09/15 2218 280 lb (127.007 kg)     Height 04/09/15 2218  (1.803 m)     Head Cir --      Peak Flow --      Pain Score 04/09/15 2218 0     Pain Loc --      Pain Edu? --      Excl. in GC? --      Constitutional: Alert and oriented. Patient very malodorous  Eyes: Conjunctivae are normal. PERRL. Normal extraocular movements. ENT   Head: Normocephalic and atraumatic.   Nose: No congestion/rhinnorhea.   Mouth/Throat: Mucous membranes are moist.   Neck: No stridor. Hematological/Lymphatic/Immunilogical: No cervical lymphadenopathy. Cardiovascular: Normal rate, regular rhythm. Normal and symmetric distal pulses are present in all extremities. No murmurs, rubs, or gallops. Respiratory: Normal respiratory effort without tachypnea nor retractions. Breath sounds are clear and equal bilaterally. No wheezes/rales/rhonchi. Gastrointestinal: Soft and nontender. No distention. There is no CVA tenderness. Genitourinary: deferred Musculoskeletal: Nontender with normal range of motion in all extremities. No joint effusions.  No lower extremity tenderness nor edema. Neurologic:  Normal speech and language. No gross focal neurologic deficits are appreciated. Speech is normal.  Skin:  Skin is warm, dry and intact. No rash noted. Psychiatric: Depressed mood  ____________________________________________    LABS (pertinent positives/negatives)  Labs Reviewed  ACETAMINOPHEN LEVEL - Abnormal; Notable for the following:    Acetaminophen (Tylenol), Serum <10 (*)    All other components within normal limits  URINALYSIS COMPLETEWITH MICROSCOPIC ([facility] ONLY) - Abnormal; Notable for the following:    Color, Urine YELLOW (*)  APPearance CLEAR (*)    Ketones, ur 2+ (*)    All other components within normal limits  CBC  COMPREHENSIVE METABOLIC PANEL  ETHANOL  SALICYLATE LEVEL  URINE DRUG SCREEN, QUALITATIVE ([facility] ONLY)       INITIAL IMPRESSION /  ASSESSMENT AND PLAN / ED COURSE  Pertinent labs & imaging results that were available during my care of the patient were reviewed by me and considered in my medical decision making (see chart for details).  Patient involuntarily committed due to suicidal ideation.  ____________________________________________   FINAL CLINICAL IMPRESSION(S) / ED DIAGNOSES  Final diagnoses:  Suicidal ideation  Depression      Darci Currentandolph N Adalae Baysinger, MD 04/12/15 [phone]

## 2015-05-18 DIAGNOSIS — F329 Major depressive disorder, single episode, unspecified: Secondary | ICD-10-CM | POA: Insufficient documentation

## 2015-05-19 ENCOUNTER — Emergency Department
Admission: EM | Admit: 2015-05-19 | Discharge: 2015-05-19 | Disposition: A | Discharge status: Home / Self Care | Payer: Medicaid Other | Attending: Emergency Medicine | Admitting: Emergency Medicine

## 2015-05-19 ENCOUNTER — Encounter: Payer: Self-pay | Admitting: *Deleted

## 2015-05-19 DIAGNOSIS — R45851 Suicidal ideations: Secondary | ICD-10-CM

## 2015-05-19 DIAGNOSIS — F329 Major depressive disorder, single episode, unspecified: Secondary | ICD-10-CM

## 2015-05-19 DIAGNOSIS — F32A Depression, unspecified: Secondary | ICD-10-CM

## 2015-05-19 HISTORY — DX: Suicidal ideations: R45.851

## 2015-05-19 LAB — URINALYSIS COMPLETE WITH MICROSCOPIC (ARMC ONLY)
Bilirubin Urine: NEGATIVE
Glucose, UA: NEGATIVE mg/dL
Hgb urine dipstick: NEGATIVE
Ketones, ur: NEGATIVE mg/dL
Leukocytes, UA: NEGATIVE
NITRITE: NEGATIVE
PROTEIN: 30 mg/dL — AB
Specific Gravity, Urine: 1.027 (ref 1.005–1.030)
Squamous Epithelial / LPF: NONE SEEN
pH: 5 (ref 5.0–8.0)

## 2015-05-19 LAB — CBC WITH DIFFERENTIAL/PLATELET
BASOS PCT: 0 %
Basophils Absolute: 0 10*3/uL (ref 0–0.1)
EOS ABS: 0.1 10*3/uL (ref 0–0.7)
EOS PCT: 1 %
HCT: 49.9 % (ref 40.0–52.0)
Hemoglobin: 17.6 g/dL (ref 13.0–18.0)
Lymphocytes Relative: 18 %
Lymphs Abs: 1.3 10*3/uL (ref 1.0–3.6)
MCH: 31.1 pg (ref 26.0–34.0)
MCHC: 35.2 g/dL (ref 32.0–36.0)
MCV: 88.4 fL (ref 80.0–100.0)
Monocytes Absolute: 0.5 10*3/uL (ref 0.2–1.0)
Monocytes Relative: 7 %
NEUTROS PCT: 74 %
Neutro Abs: 5.4 10*3/uL (ref 1.4–6.5)
PLATELETS: 198 10*3/uL (ref 150–440)
RBC: 5.65 MIL/uL (ref 4.40–5.90)
RDW: 13.2 % (ref 11.5–14.5)
WBC: 7.3 10*3/uL (ref 3.8–10.6)

## 2015-05-19 LAB — COMPREHENSIVE METABOLIC PANEL
ALK PHOS: 93 U/L (ref 38–126)
ALT: 41 U/L (ref 17–63)
AST: 34 U/L (ref 15–41)
Albumin: 4.8 g/dL (ref 3.5–5.0)
Anion gap: 7 (ref 5–15)
BUN: 11 mg/dL (ref 6–20)
CHLORIDE: 106 mmol/L (ref 101–111)
CO2: 26 mmol/L (ref 22–32)
Calcium: 9.2 mg/dL (ref 8.9–10.3)
Creatinine, Ser: 1.11 mg/dL (ref 0.61–1.24)
GFR calc Af Amer: >60 mL/min (ref >60)
Glucose, Bld: 101 mg/dL — ABNORMAL HIGH (ref 65–99)
Potassium: 4.1 mmol/L (ref 3.5–5.1)
Sodium: 139 mmol/L (ref 135–145)
Total Bilirubin: 0.9 mg/dL (ref 0.3–1.2)
Total Protein: 8 g/dL (ref 6.5–8.1)

## 2015-05-19 LAB — URINE DRUG SCREEN, QUALITATIVE (ARMC ONLY)
Amphetamines, Ur Screen: NOT DETECTED
BARBITURATES, UR SCREEN: NOT DETECTED
BENZODIAZEPINE, UR SCRN: NOT DETECTED
CANNABINOID 50 NG, UR ~~LOC~~: NOT DETECTED
Cocaine Metabolite,Ur ~~LOC~~: NOT DETECTED
MDMA (Ecstasy)Ur Screen: NOT DETECTED
Methadone Scn, Ur: NOT DETECTED
OPIATE, UR SCREEN: NOT DETECTED
Phencyclidine (PCP) Ur S: NOT DETECTED
Tricyclic, Ur Screen: NOT DETECTED

## 2015-05-19 LAB — ETHANOL: Alcohol, Ethyl (B): <5 mg/dL (ref <5)

## 2015-05-19 MED ORDER — TRAZODONE HCL 50 MG PO TABS: 50.0000 mg | ORAL_TABLET | Freq: Every day | ORAL | Status: DC

## 2015-05-19 MED ORDER — CITALOPRAM HYDROBROMIDE 20 MG PO TABS: 20.0000 mg | ORAL_TABLET | Freq: Every day | ORAL | Status: DC

## 2015-05-19 NOTE — Consult Note (Signed)
[place] [facility] Consult   Reason for Consult: Follow up Referring Physician:  Er Patient Identification: Roger Miranda MRN:  [id number] Principal Diagnosis: <principal problem not specified> Diagnosis:  There are no active problems to display for this patient.   Total Time spent with patient: 45 minutes  Subjective:   Roger Miranda is a 24 y.o. 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  HPI:  Pt has had problems many times with mother and she kicked him out of her house on many Similar occasions. HPI Elements:     Past Medical History:  Past Medical History  Diagnosis Date  . Depression   . Suicidal thoughts    History reviewed. No pertinent past surgical history. Family History: History reviewed. No pertinent family history. Social History:  History  Alcohol Use  . Yes    Comment: socially     History  Drug Use No    History   Social History  . Marital Status: Single    Spouse Name: N/A  . Number of Children: N/A  . Years of Education: N/A   Social History Main Topics  . Smoking status: Never Smoker   . Smokeless tobacco: Never Used  . Alcohol Use: Yes     Comment: socially  . Drug Use: No  . Sexual Activity: No   Other Topics Concern  . None   Social History Narrative   Additional Social History:    History of alcohol / drug use?: No history of alcohol / drug abuse                     Allergies:  No Known Allergies  Labs:  Results for orders placed or performed during the hospital encounter of 05/19/15 (from the past 48 hour(s))  CBC WITH DIFFERENTIAL     Status: None   Collection Time: 05/19/15 12:24 AM  Result Value Ref Range   WBC 7.3 3.8 - 10.6 K/uL   RBC 5.65 4.40 - 5.90 MIL/uL   Hemoglobin 17.6 13.0 - 18.0 g/dL   HCT 49.9 40.0 - 52.0 %   MCV 88.4 80.0 - 100.0 fL   MCH 31.1 26.0 - 34.0 pg    MCHC 35.2 32.0 - 36.0 g/dL   RDW 13.2 11.5 - 14.5 %   Platelets 198 150 - 440 K/uL   Neutrophils Relative % 74 %   Neutro Abs 5.4 1.4 - 6.5 K/uL   Lymphocytes Relative 18 %   Lymphs Abs 1.3 1.0 - 3.6 K/uL   Monocytes Relative 7 %   Monocytes Absolute 0.5 0.2 - 1.0 K/uL   Eosinophils Relative 1 %   Eosinophils Absolute 0.1 0 - 0.7 K/uL   Basophils Relative 0 %   Basophils Absolute 0.0 0 - 0.1 K/uL  Comprehensive metabolic panel     Status: Abnormal   Collection Time: 05/19/15 12:24 AM  Result Value Ref Range   Sodium 139 135 - 145 mmol/L   Potassium 4.1 3.5 - 5.1 mmol/L   Chloride 106 101 - 111 mmol/L   CO2 26 22 - 32 mmol/L   Glucose, Bld 101 (H) 65 - 99 mg/dL   BUN 11 6 - 20 mg/dL   Creatinine, Ser 1.11 0.61 - 1.24 mg/dL   Calcium 9.2 8.9 - 10.3 mg/dL   Total Protein 8.0 6.5 -  8.1 g/dL   Albumin 4.8 3.5 - 5.0 g/dL   AST 34 15 - 41 U/L   ALT 41 17 - 63 U/L   Alkaline Phosphatase 93 38 - 126 U/L   Total Bilirubin 0.9 0.3 - 1.2 mg/dL   GFR calc non Af Amer >60 >60 mL/min   GFR calc Af Amer >60 >60 mL/min    Comment: (NOTE) The eGFR has been calculated using the CKD EPI equation. This calculation has not been validated in all clinical situations. eGFR's persistently <60 mL/min signify possible Chronic Kidney Disease.    Anion gap 7 5 - 15  Ethanol     Status: None   Collection Time: 05/19/15 12:24 AM  Result Value Ref Range   Alcohol, Ethyl (B) <5 <5 mg/dL    Comment:        LOWEST DETECTABLE LIMIT FOR SERUM ALCOHOL IS 5 mg/dL FOR MEDICAL PURPOSES ONLY   Urinalysis complete, with microscopic ([facility] only)     Status: Abnormal   Collection Time: 05/19/15 12:24 AM  Result Value Ref Range   Color, Urine YELLOW (A) YELLOW   APPearance TURBID (A) CLEAR   Glucose, UA NEGATIVE NEGATIVE mg/dL   Bilirubin Urine NEGATIVE NEGATIVE   Ketones, ur NEGATIVE NEGATIVE mg/dL   Specific Gravity, Urine 1.027 1.005 - 1.030   Hgb urine dipstick NEGATIVE NEGATIVE   pH 5.0 5.0 - 8.0    Protein, ur 30 (A) NEGATIVE mg/dL   Nitrite NEGATIVE NEGATIVE   Leukocytes, UA NEGATIVE NEGATIVE   RBC / HPF 0-5 0 - 5 RBC/hpf   WBC, UA 0-5 0 - 5 WBC/hpf   Bacteria, UA RARE (A) NONE SEEN   Squamous Epithelial / LPF NONE SEEN NONE SEEN   Mucous PRESENT   Urine Drug Screen, Qualitative ([facility] only)     Status: None   Collection Time: 05/19/15 12:24 AM  Result Value Ref Range   Tricyclic, Ur Screen NONE DETECTED NONE DETECTED   Amphetamines, Ur Screen NONE DETECTED NONE DETECTED   MDMA (Ecstasy)Ur Screen NONE DETECTED NONE DETECTED   Cocaine Metabolite,Ur [place] NONE DETECTED NONE DETECTED   Opiate, Ur Screen NONE DETECTED NONE DETECTED   Phencyclidine (PCP) Ur S NONE DETECTED NONE DETECTED   Cannabinoid 50 Ng, Ur [place] NONE DETECTED NONE DETECTED   Barbiturates, Ur Screen NONE DETECTED NONE DETECTED   Benzodiazepine, Ur Scrn NONE DETECTED NONE DETECTED   Methadone Scn, Ur NONE DETECTED NONE DETECTED    Comment: (NOTE) 680  Tricyclics, urine               Cutoff 1000 ng/mL 200  Amphetamines, urine             Cutoff 1000 ng/mL 300  MDMA (Ecstasy), urine           Cutoff 500 ng/mL 400  Cocaine Metabolite, urine       Cutoff 300 ng/mL 500  Opiate, urine                   Cutoff 300 ng/mL 600  Phencyclidine (PCP), urine      Cutoff 25 ng/mL 700  Cannabinoid, urine              Cutoff 50 ng/mL 800  Barbiturates, urine             Cutoff 200 ng/mL 900  Benzodiazepine, urine           Cutoff 200 ng/mL 1000 Methadone, urine  Cutoff 300 ng/mL 1100 1200 The urine drug screen provides only a preliminary, unconfirmed 1300 analytical test result and should not be used for non-medical 1400 purposes. Clinical consideration and professional judgment should 1500 be applied to any positive drug screen result due to possible 1600 interfering substances. A more specific alternate chemical method 1700 must be used in order to obtain a confirmed analytical result.  1800 Gas chromato graphy  / mass spectrometry (GC/MS) is the preferred 1900 confirmatory method.     Vitals: Blood pressure 133/84, pulse 83, temperature 98.2 F (36.8 C), temperature source Oral, resp. rate 16, height 6' (1.829 m), weight 132.269 kg (291 lb 9.6 oz), SpO2 100 %.  Risk to Self: Suicidal Ideation: Yes-Currently Present Suicidal Intent: Yes-Currently Present Is patient at risk for suicide?: Yes Suicidal Plan?: No Access to Means: No What has been your use of drugs/alcohol within the last 12 months?: None Reported How many times?: 0 Other Self Harm Risks: None Reported Triggers for Past Attempts:  (na) Intentional Self Injurious Behavior: None Risk to Others: Homicidal Ideation: No Thoughts of Harm to Others: No Current Homicidal Intent: No Current Homicidal Plan: No Access to Homicidal Means: No Identified Victim: None Reported History of harm to others?: No Assessment of Violence: None Noted Violent Behavior Description: None Reported Does patient have access to weapons?: No Criminal Charges Pending?: No Does patient have a court date: No Prior Inpatient Therapy: Prior Inpatient Therapy: Yes Prior Therapy Dates: 2011 Prior Therapy Facilty/Provider(s): Unable to remember the name of the facility Reason for Treatment: Depression Prior Outpatient Therapy: Prior Outpatient Therapy: No Prior Therapy Dates: None Reported Prior Therapy Facilty/Provider(s): None Reported Reason for Treatment: Medication Management Does patient have an ACCT team?: No Does patient have Intensive In-House Services?  : No Does patient have [facility] services? : No Does patient have P4CC services?: No  No current facility-administered medications for this encounter.   No current outpatient prescriptions on file.    Musculoskeletal: Strength & Muscle Tone: within normal limits Gait & Station: normal Patient leans: N/A  Psychiatric Specialty Exam: Physical Exam  Review of Systems  Constitutional:  Negative.   HENT: Negative.   Eyes: Negative.   Respiratory: Negative.   Cardiovascular: Negative.   Gastrointestinal: Negative.   Genitourinary: Negative.   Musculoskeletal: Negative.   Skin: Negative.   Neurological: Negative.   Endo/Heme/Allergies: Negative.   Psychiatric/Behavioral: Positive for depression. The patient is nervous/anxious and has insomnia.     Blood pressure 133/84, pulse 83, temperature 98.2 F (36.8 C), temperature source Oral, resp. rate 16, height 6' (1.829 m), weight 132.269 kg (291 lb 9.6 oz), SpO2 100 %.Body mass index is 39.54 kg/(m^2).  General Appearance: Casual  Eye Contact::  Fair  Speech:  Normal Rate  Volume:  Normal  Mood:  Anxious  Affect:  Appropriate  Thought Process:  Coherent  Orientation:  Full (Time, Place, and Person)  Thought Content:  WDL  Suicidal Thoughts:  No  Homicidal Thoughts:  No  Memory:  Immediate;   Fair Recent;   Fair Remote;   Fair adequate  Judgement:  Fair  Insight:  Fair  Psychomotor Activity:  Normal  Concentration:  Fair  Recall:  [employer] [place]  Language: Fair  Akathisia:  No  Handed:  Right  AIMS (if indicated):     Assets:  Communication Skills Desire for Improvement Talents/Skills Transportation  ADL's:  Intact  Cognition: WNL  Sleep:      Medical Decision Making: Review of  New Medication or Change in Dosage (2)  Treatment Plan Summary: Plan Start pt on a low dose of Celexa to help mood and Trazadone to help rest and D/C pt today with follow up apt with [facility]  Plan:  No evidence of imminent risk to self or others at present.   Disposition: as above.  Dewain Penning 05/19/2015 3:51 PM

## 2015-05-19 NOTE — ED Notes (Signed)

## 2015-05-19 NOTE — ED Notes (Signed)
BEHAVIORAL HEALTH ROUNDING Patient sleeping: YES Patient alert and oriented: Yes Behavior appropriate: Yes.  ; If no, describe:   Nutrition and fluids offered: No Toileting and hygiene offered: No Sitter present: no Law enforcement present: Yes  and ODS

## 2015-05-19 NOTE — ED Notes (Signed)
ED BHU PLACEMENT JUSTIFICATION Is the patient under IVC or is there intent for IVC: Yes.   Is the patient medically cleared: No. Is there vacancy in the ED BHU: Yes.   Is the population mix appropriate for patient: Yes.   Is the patient awaiting placement in inpatient or outpatient setting: Yes.   Has the patient had a psychiatric consult: No. Survey of unit performed for contraband, proper placement and condition of furniture, tampering with fixtures in bathroom, shower, and each patient room: Yes.  ; Findings:  APPEARANCE/BEHAVIOR calm, cooperative and adequate rapport can be established NEURO ASSESSMENT Orientation: time, place and person Hallucinations: No.None noted (Hallucinations) Speech: Normal Gait: normal RESPIRATORY ASSESSMENT Normal expansion.  Clear to auscultation.  No rales, rhonchi, or wheezing. CARDIOVASCULAR ASSESSMENT regular rate and rhythm, S1, S2 normal, no murmur, click, rub or gallop GASTROINTESTINAL ASSESSMENT soft, nontender, BS WNL, no r/g EXTREMITIES normal strength, tone, and muscle mass PLAN OF CARE Provide calm/safe environment. Vital signs assessed twice daily. ED BHU Assessment once each 12-hour shift. Collaborate with intake RN daily or as condition indicates. Assure the ED provider has rounded once each shift. Provide and encourage hygiene. Provide redirection as needed. Assess for escalating behavior; address immediately and inform ED provider.  Assess family dynamic and appropriateness for visitation as needed: Yes.  ; If necessary, describe findings:  Educate the patient/family about BHU procedures/visitation: Yes.  ; If necessary, describe findings:  

## 2015-05-19 NOTE — BH Assessment (Signed)
Assessment Note  Roger Miranda is an 24 y.o. male.   Axis I: Major Depression, Recurrent severe Axis II: Deferred Axis III:  Past Medical History  Diagnosis Date  . Depression   . Suicidal thoughts    Axis IV: economic problems, housing problems, occupational problems, other psychosocial or environmental problems, problems related to social environment and problems with primary support group Axis V: 41-50 serious symptoms  Past Medical History:  Past Medical History  Diagnosis Date  . Depression   . Suicidal thoughts     History reviewed. No pertinent past surgical history.  Family History: History reviewed. No pertinent family history.  Social History:  reports that he has never smoked. He has never used smokeless tobacco. He reports that he drinks alcohol. He reports that he does not use illicit drugs.  Additional Social History:  Alcohol / Drug Use History of alcohol / drug use?: No history of alcohol / drug abuse  CIWA: CIWA-Ar BP: (!) 145/95 mmHg Pulse Rate: 96 COWS:    Allergies: No Known Allergies  Home Medications:  (Not in a hospital admission)  OB/GYN Status:  No LMP for male patient.  General Assessment Data [facility] Assessment: In system Is this a Tele or Face-to-Face Assessment?: Face-to-Face Is this an Initial Assessment or a Re-assessment for this encounter?: Initial Assessment Marital status: Single Maiden name: na Is patient pregnant?: Other (Comment) Pregnancy Status: Other (Comment) Living Arrangements: Parent Can pt return to current living arrangement?: Yes Admission Status: Involuntary Is patient capable of signing voluntary admission?: No Referral Source: Self/Family/Friend Insurance type: Medicaid  Medical Screening Exam [facility] ONLY) Medical Exam completed: Yes  Crisis Care Plan Living Arrangements: Parent Name of Psychiatrist: None Reported Name of Therapist: None Reported  Education Status Is  patient currently in school?: No Current Grade: na Highest grade of school patient has completed: 12th Name of school: na Contact person: na  Risk to self with the past 6 months Suicidal Ideation: Yes-Currently Present Has patient been a risk to self within the past 6 months prior to admission? : Yes Suicidal Intent: Yes-Currently Present Has patient had any suicidal intent within the past 6 months prior to admission? : Yes Is patient at risk for suicide?: Yes Suicidal Plan?: No Has patient had any suicidal plan within the past 6 months prior to admission? : No Access to Means: No What has been your use of drugs/alcohol within the last 12 months?: None Reported Previous Attempts/Gestures: No How many times?: 0 Other Self Harm Risks: None Reported Triggers for Past Attempts:  (na) Intentional Self Injurious Behavior: None Family Suicide History: No Recent stressful life event(s): Conflict (Comment), Financial Problems (Homeless, unemployed, not taking medication) Persecutory voices/beliefs?: No Depression: Yes Depression Symptoms: Despondent, Tearfulness, Isolating, Fatigue, Guilt, Loss of interest in usual pleasures, Feeling worthless/self pity, Feeling angry/irritable Substance abuse history and/or treatment for substance abuse?: No Suicide prevention information given to non-admitted patients: Yes  Risk to Others within the past 6 months Homicidal Ideation: No Does patient have any lifetime risk of violence toward others beyond the six months prior to admission? : No Thoughts of Harm to Others: No Current Homicidal Intent: No Current Homicidal Plan: No Access to Homicidal Means: No Identified Victim: None Reported History of harm to others?: No Assessment of Violence: None Noted Violent Behavior Description: None Reported Does patient have access to weapons?: No Criminal Charges Pending?: No Does patient have a court date: No Is patient on probation?:  No  Psychosis Hallucinations: None noted Delusions: None noted  Mental Status Report Appearance/Hygiene: In scrubs Eye Contact: Good Motor Activity: Freedom of movement Speech: Unremarkable Level of Consciousness: Alert Mood: Depressed Affect: Blunted, Depressed Anxiety Level: None Thought Processes: Coherent, Relevant Judgement: Unimpaired Orientation: Person, Place, Time, Situation Obsessive Compulsive Thoughts/Behaviors: None  Cognitive Functioning Concentration: Normal Memory: Recent Intact, Remote Intact IQ: Average Insight: Fair Impulse Control: Poor Appetite: Fair Weight Loss: 0 Weight Gain: 0 Sleep: No Change Total Hours of Sleep: 8 Vegetative Symptoms: None  ADLScreening [facility] Assessment Services) Patient's cognitive ability adequate to safely complete daily activities?: Yes Patient able to express need for assistance with ADLs?: Yes Independently performs ADLs?: Yes (appropriate for developmental age)  Prior Inpatient Therapy Prior Inpatient Therapy: Yes Prior Therapy Dates: 2011 Prior Therapy Facilty/Provider(s): Unable to remember the name of the facility Reason for Treatment: Depression  Prior Outpatient Therapy Prior Outpatient Therapy: No Prior Therapy Dates: None Reported Prior Therapy Facilty/Provider(s): None Reported Reason for Treatment: Medication Management Does patient have an ACCT team?: No Does patient have Intensive In-House Services?  : No Does patient have [facility] services? : No Does patient have P4CC services?: No  ADL Screening (condition at time of admission) Patient's cognitive ability adequate to safely complete daily activities?: Yes Is the patient deaf or have difficulty hearing?: No Does the patient have difficulty seeing, even when wearing glasses/contacts?: No Does the patient have difficulty concentrating, remembering, or making decisions?: No Patient able to express need for assistance with ADLs?: Yes Does the patient  have difficulty dressing or bathing?: No Independently performs ADLs?: Yes (appropriate for developmental age) Does the patient have difficulty walking or climbing stairs?: No Weakness of Legs: None Weakness of Arms/Hands: None  Home Assistive Devices/Equipment Home Assistive Devices/Equipment: None    Abuse/Neglect Assessment (Assessment to be complete while patient is alone) Physical Abuse: Denies Verbal Abuse: Denies Sexual Abuse: Denies Exploitation of patient/patient's resources: Denies Self-Neglect: Denies Values / Beliefs Cultural Requests During Hospitalization: None Spiritual Requests During Hospitalization: None Consults Spiritual Care Consult Needed: No Social Work Consult Needed: No Merchant navy officer (For Healthcare) Does patient have an advance directive?: No Would patient like information on creating an advanced directive?: No - patient declined information    Additional Information 1:1 In Past 12 Months?: No CIRT Risk: No Elopement Risk: No Does patient have medical clearance?: Yes     Disposition: Pending psych disposition in the morning.  Disposition Initial Assessment Completed for this Encounter: Yes Disposition of Patient: Other dispositions (Pending psych disposition. ) Other disposition(s):  (Pending psych disposition.)  On Site Evaluation by:   Reviewed with Physician:    Phillip Heal LaVerne 05/19/2015 2:56 AM

## 2015-05-19 NOTE — [facility] Counselor (Signed)
Discussed pt. with Psych MD (Dr. Challa), pt. is able to d/c. Pt. have been giving information and instructions on how to follow up with Outpatient Treatment ([facility] and Mobile Crisis. ER MD (Dr. K. Paduchowski) was updated as well. 

## 2015-05-19 NOTE — ED Provider Notes (Signed)
-----------------------------------------   2:49 PM on 05/19/2015 -----------------------------------------  Patient has been seen and examined by Dr.Challa who states the patient is safe for discharge home. Patient will follow up with [facility]. We will discharge patient home at this time.  Minna AntisKevin Domique Reardon, MD 05/19/15 [phone]

## 2015-05-19 NOTE — Discharge Instructions (Signed)
Depression °Depression refers to feeling sad, low, down in the dumps, blue, gloomy, or empty. In general, there are two kinds of depression: °1. Normal sadness or normal grief. This kind of depression is one that we all feel from time to time after upsetting life experiences, such as the loss of a job or the ending of a relationship. This kind of depression is considered normal, is short lived, and resolves within a few days to 2 weeks. Depression experienced after the loss of a loved one (bereavement) often lasts longer than 2 weeks but normally gets better with time. °2. Clinical depression. This kind of depression lasts longer than normal sadness or normal grief or interferes with your ability to function at home, at work, and in school. It also interferes with your personal relationships. It affects almost every aspect of your life. Clinical depression is an illness. °Symptoms of depression can also be caused by conditions other than those mentioned above, such as: °· Physical illness. Some physical illnesses, including underactive thyroid gland (hypothyroidism), severe anemia, specific types of cancer, diabetes, uncontrolled seizures, heart and lung problems, strokes, and chronic pain are commonly associated with symptoms of depression. °· Side effects of some prescription medicine. In some people, certain types of medicine can cause symptoms of depression. °· Substance abuse. Abuse of alcohol and illicit drugs can cause symptoms of depression. °SYMPTOMS °Symptoms of normal sadness and normal grief include the following: °· Feeling sad or crying for short periods of time. °· Not caring about anything (apathy). °· Difficulty sleeping or sleeping too much. °· No longer able to enjoy the things you used to enjoy. °· Desire to be by oneself all the time (social isolation). °· Lack of energy or motivation. °· Difficulty concentrating or remembering. °· Change in appetite or weight. °· Restlessness or  agitation. °Symptoms of clinical depression include the same symptoms of normal sadness or normal grief and also the following symptoms: °· Feeling sad or crying all the time. °· Feelings of guilt or worthlessness. °· Feelings of hopelessness or helplessness. °· Thoughts of suicide or the desire to harm yourself (suicidal ideation). °· Loss of touch with reality (psychotic symptoms). Seeing or hearing things that are not real (hallucinations) or having false beliefs about your life or the people around you (delusions and paranoia). °DIAGNOSIS  °The diagnosis of clinical depression is usually based on how bad the symptoms are and how long they have lasted. Your health care provider will also ask you questions about your medical history and substance use to find out if physical illness, use of prescription medicine, or substance abuse is causing your depression. Your health care provider may also order blood tests. °TREATMENT  °Often, normal sadness and normal grief do not require treatment. However, sometimes antidepressant medicine is given for bereavement to ease the depressive symptoms until they resolve. °The treatment for clinical depression depends on how bad the symptoms are but often includes antidepressant medicine, counseling with a mental health professional, or both. Your health care provider will help to determine what treatment is best for you. °Depression caused by physical illness usually goes away with appropriate medical treatment of the illness. If prescription medicine is causing depression, talk with your health care provider about stopping the medicine, decreasing the dose, or changing to another medicine. °Depression caused by the abuse of alcohol or illicit drugs goes away when you stop using these substances. Some adults need professional help in order to stop drinking or using drugs. °SEEK IMMEDIATE MEDICAL   CARE IF:  You have thoughts about hurting yourself or others.  You lose touch  with reality (have psychotic symptoms).  You are taking medicine for depression and have a serious side effect. FOR MORE INFORMATION  National Alliance on Mental Illness: www.nami.[employer]  National Institute of Mental Health: http://www.maynard.net/www.nimh.nih.gov Document Released: 10/23/2000 Document Revised: 03/12/2014 Document Reviewed: 01/25/2012 [facility] Patient Information 2015 [place], [place]. This information is not intended to replace advice given to you by your health care provider. Make sure you discuss any questions you have with your health care provider.      Please follow-up with [facility]. Return to the emergency department if you're having any thoughts of hurting herself or anyone else so that we may attempt to help you.

## 2015-05-19 NOTE — ED Notes (Signed)
BEHAVIORAL HEALTH ROUNDING Patient sleeping: Yes.   Patient alert and oriented: not applicable Behavior appropriate: Yes.  ; If no, describe:   Nutrition and fluids offered: No Toileting and hygiene offered: No Sitter present: no Law enforcement present: Yes  and ODS    

## 2015-05-19 NOTE — ED Notes (Addendum)

## 2015-05-19 NOTE — ED Notes (Signed)
BEHAVIORAL HEALTH ROUNDING Patient sleeping: No. Patient alert and oriented: yes Behavior appropriate: Yes.  ; If no, describe:  Nutrition and fluids offered: Yes  Toileting and hygiene offered: Yes  Sitter present: not applicable Law enforcement present: Yes  

## 2015-05-19 NOTE — ED Notes (Signed)
Pt states long hx of SI. Pt got into an argument w/ mother tonight and made suicidal statement. Pt denies plan but states he has a constant feeling of SI.

## 2015-05-19 NOTE — ED Provider Notes (Signed)
[facility] Emergency Department Provider Note  ____________________________________________  Time seen: Approximately 308 AM  I have reviewed the triage vital signs and the nursing notes.   HISTORY  Chief Complaint Suicidal    HPI Roger Miranda is a 24 y.o. male who is suicidal. The patient reports that he has been feeling this way for a while. The patient has no plan and has never attempted suicide in the past. The patient reports that his entire life makes him feel suicidal and there is no specific events to trigger the symptoms. The patient has depression and reports that he has been on medications in the past but nothing is ever work. The patient reports there are too many medications to remember. He denies any homicidal ideation, psychosis, any other concerns. The patient has not been drinking or taking any illegal drugs.According to triage the patient got into an argument with his mother and made a suicidal statements.   Past Medical History  Diagnosis Date  . Depression   . Suicidal thoughts     There are no active problems to display for this patient.   History reviewed. No pertinent past surgical history.  No current outpatient prescriptions on file.  Allergies Review of patient's allergies indicates no known allergies.  History reviewed. No pertinent family history.  Social History History  Substance Use Topics  . Smoking status: Never Smoker   . Smokeless tobacco: Never Used  . Alcohol Use: Yes     Comment: socially    Review of Systems Constitutional: No fever/chills Eyes: No visual changes. ENT: No sore throat. Cardiovascular: Denies chest pain. Respiratory: Denies shortness of breath. Gastrointestinal: No abdominal pain.  No nausea, no vomiting.  No diarrhea.  No constipation. Genitourinary: Negative for dysuria. Musculoskeletal: Negative for back pain. Skin: Negative for rash. Neurological: Negative for headaches, focal  weakness or numbness. Psychiatric:Suicidal ideation  10-point ROS otherwise negative.  ____________________________________________   PHYSICAL EXAM:  VITAL SIGNS: ED Triage Vitals  Enc Vitals Group     BP 05/19/15 0010 145/95 mmHg     Pulse Rate 05/19/15 0010 96     Resp 05/19/15 0010 18     Temp 05/19/15 0010 99.4 F (37.4 C)     Temp Source 05/19/15 0010 Oral     SpO2 05/19/15 0010 94 %     Weight 05/19/15 0010 291 lb 9.6 oz (132.269 kg)     Height 05/19/15 0010 6' (1.829 m)     Head Cir --      Peak Flow --      Pain Score --      Pain Loc --      Pain Edu? --      Excl. in GC? --     Constitutional: Alert and oriented. Well appearing and in no acute distress. Eyes: Conjunctivae are normal. PERRL. EOMI. Head: Atraumatic. Nose: No congestion/rhinnorhea. Mouth/Throat: Mucous membranes are moist.  Oropharynx non-erythematous. Cardiovascular: Normal rate, regular rhythm. Grossly normal heart sounds.  Good peripheral circulation. Respiratory: Normal respiratory effort.  No retractions. Lungs CTAB. Gastrointestinal: Soft and nontender. No distention. Positive bowel sounds Genitourinary: Deferred Musculoskeletal: No lower extremity tenderness nor edema.  No joint effusions. Neurologic:  Normal speech and language. No gross focal neurologic deficits are appreciated.  Skin:  Skin is warm, dry and intact. No rash noted. Psychiatric: Mood and affect are normal.   ____________________________________________   LABS (all labs ordered are listed, but only abnormal results are displayed)  Labs Reviewed  COMPREHENSIVE METABOLIC PANEL - Abnormal; Notable for the following:    Glucose, Bld 101 (*)    All other components within normal limits  URINALYSIS COMPLETEWITH MICROSCOPIC ([facility] ONLY) - Abnormal; Notable for the following:    Color, Urine YELLOW (*)    APPearance TURBID (*)    Protein, ur 30 (*)    Bacteria, UA RARE (*)    All other components within normal limits   CBC WITH DIFFERENTIAL/PLATELET  ETHANOL  URINE DRUG SCREEN, QUALITATIVE ([facility] ONLY)   ____________________________________________  EKG  None ____________________________________________  RADIOLOGY  None ____________________________________________   PROCEDURES  Procedure(s) performed: None  Critical Care performed: No  ____________________________________________   INITIAL IMPRESSION / ASSESSMENT AND PLAN / ED COURSE  Pertinent labs & imaging results that were available during my care of the patient were reviewed by me and considered in my medical decision making (see chart for details).  The patient is a 24 year old male who comes in with suicidal ideation. The patient's blood work is unremarkable at this time. The patient will be evaluated by psych for his suicidal thoughts and ideation. Otherwise patient has no other complaints or concerns at this time. ____________________________________________   FINAL CLINICAL IMPRESSION(S) / ED DIAGNOSES  Final diagnoses:  Suicidal ideation      Rebecka ApleyAllison P Lachell Rochette, MD 05/19/15 [phone]

## 2015-05-19 NOTE — ED Notes (Signed)
BEHAVIORAL HEALTH ROUNDING Patient sleeping: No. Patient alert and oriented: yes Behavior appropriate: Yes.  ; If no, describe:  Nutrition and fluids offered: No Toileting and hygiene offered: Yes  Sitter present: not applicable Law enforcement present: Yes  

## 2015-05-19 NOTE — ED Notes (Signed)
BEHAVIORAL HEALTH ROUNDING Patient sleeping: No. Patient alert and oriented: Yes Behavior appropriate: Yes.  ; If no, describe:   Nutrition and fluids offered: Yes  Toileting and hygiene offered: Yes  Sitter present: no Law enforcement present: Yes  and ODS   

## 2015-05-19 NOTE — ED Notes (Signed)
BEHAVIORAL HEALTH ROUNDING Patient sleeping: Yes.   Patient alert and oriented: not applicable Behavior appropriate: Yes.  ; If no, describe:  Nutrition and fluids offered: No Toileting and hygiene offered: No Sitter present: not applicable Law enforcement present: Yes  

## 2015-05-19 NOTE — ED Notes (Signed)
PR 106  RR 22  MD aware.  Will continue to monitor.

## 2015-05-20 ENCOUNTER — Emergency Department
Admission: EM | Admit: 2015-05-20 | Discharge: 2015-05-20 | Disposition: A | Discharge status: Home / Self Care | Payer: Medicaid Other | Attending: Emergency Medicine | Admitting: Emergency Medicine

## 2015-05-20 ENCOUNTER — Encounter: Payer: Self-pay | Admitting: Emergency Medicine

## 2015-05-20 DIAGNOSIS — K2970 Gastritis, unspecified, without bleeding: Secondary | ICD-10-CM | POA: Insufficient documentation

## 2015-05-20 DIAGNOSIS — R5383 Other fatigue: Secondary | ICD-10-CM | POA: Insufficient documentation

## 2015-05-20 LAB — CBC
HEMATOCRIT: 47.2 % (ref 40.0–52.0)
Hemoglobin: 16.4 g/dL (ref 13.0–18.0)
MCH: 30.7 pg (ref 26.0–34.0)
MCHC: 34.8 g/dL (ref 32.0–36.0)
MCV: 88.4 fL (ref 80.0–100.0)
Platelets: 188 10*3/uL (ref 150–440)
RBC: 5.35 MIL/uL (ref 4.40–5.90)
RDW: 13.2 % (ref 11.5–14.5)
WBC: 5.5 10*3/uL (ref 3.8–10.6)

## 2015-05-20 LAB — COMPREHENSIVE METABOLIC PANEL
ALT: 34 U/L (ref 17–63)
AST: 27 U/L (ref 15–41)
Albumin: 4.6 g/dL (ref 3.5–5.0)
Alkaline Phosphatase: 91 U/L (ref 38–126)
Anion gap: 7 (ref 5–15)
BUN: 14 mg/dL (ref 6–20)
CO2: 28 mmol/L (ref 22–32)
Calcium: 9.2 mg/dL (ref 8.9–10.3)
Chloride: 102 mmol/L (ref 101–111)
Creatinine, Ser: 1.05 mg/dL (ref 0.61–1.24)
GFR calc non Af Amer: >60 mL/min (ref >60)
GLUCOSE: 96 mg/dL (ref 65–99)
POTASSIUM: 3.9 mmol/L (ref 3.5–5.1)
Sodium: 137 mmol/L (ref 135–145)
Total Bilirubin: 1 mg/dL (ref 0.3–1.2)
Total Protein: 7.7 g/dL (ref 6.5–8.1)

## 2015-05-20 LAB — LIPASE, BLOOD: Lipase: 50 U/L (ref 22–51)

## 2015-05-20 MED ORDER — GI COCKTAIL ~~LOC~~
30.0000 mL | Freq: Once | ORAL | Status: AC
  Administered 2015-05-20: 30 mL via ORAL
  Filled 2015-05-20: qty 30

## 2015-05-20 NOTE — ED Notes (Signed)
Pt alert and oriented X4, active, cooperative, pt in NAD. RR even and unlabored, color WNL.  Pt informed to return if any life threatening symptoms occur.   

## 2015-05-20 NOTE — Discharge Instructions (Signed)
Gastritis, Adult °Gastritis is soreness and puffiness (inflammation) of the lining of the stomach. If you do not get help, gastritis can cause bleeding and sores (ulcers) in the stomach. °HOME CARE  °· Only take medicine as told by your doctor. °· If you were given antibiotic medicines, take them as told. Finish the medicines even if you start to feel better. °· Drink enough fluids to keep your pee (urine) clear or pale yellow. °· Avoid foods and drinks that make your problems worse. Foods you may want to avoid include: °¨ Caffeine or alcohol. °¨ Chocolate. °¨ Mint. °¨ Garlic and onions. °¨ Spicy foods. °¨ Citrus fruits, including oranges, lemons, or limes. °¨ Food containing tomatoes, including sauce, chili, salsa, and pizza. °¨ Fried and fatty foods. °· Eat small meals throughout the day instead of large meals. °GET HELP RIGHT AWAY IF:  °· You have black or dark red poop (stools). °· You throw up (vomit) blood. It may look like coffee grounds. °· You cannot keep fluids down. °· Your belly (abdominal) pain gets worse. °· You have a fever. °· You do not feel better after 1 week. °· You have any other questions or concerns. °MAKE SURE YOU:  °· Understand these instructions. °· Will watch your condition. °· Will get help right away if you are not doing well or get worse. °Document Released: 04/13/2008 Document Revised: 01/18/2012 Document Reviewed: 12/09/2011 °[employer]® Patient Information ©2015 [employer]. This information is not intended to replace advice given to you by your health care provider. Make sure you discuss any questions you have with your health care provider. ° °

## 2015-05-20 NOTE — ED Notes (Signed)
Pt just discharged this am and now wants to check back in for abd pain. Pt denies any psych issues at this time.

## 2015-05-20 NOTE — ED Provider Notes (Signed)
[facility] Emergency Department Provider Note  ____________________________________________  Time seen: 2:30 PM  I have reviewed the triage vital signs and the nursing notes.   HISTORY  Chief Complaint Abdominal Pain    HPI Roger Miranda is a 24 y.o. male who complains of mild epigastric abdominal pain. He reports the pain is burning in nature and achy. He complains of being very tired because he hasn't had good sleep in 24 hours. Of note he was discharged yesterday after complaining of suicidal ideation. On direct questioning patient denies suicidal ideation today. He states he is better. He denies nausea vomiting. No fevers no chills     Past Medical History  Diagnosis Date  . Depression   . Suicidal thoughts     There are no active problems to display for this patient.   History reviewed. No pertinent past surgical history.  No current outpatient prescriptions on file.  Allergies Review of patient's allergies indicates no known allergies.  No family history on file.  Social History History  Substance Use Topics  . Smoking status: Never Smoker   . Smokeless tobacco: Never Used  . Alcohol Use: Yes     Comment: socially    Review of Systems  Constitutional: Negative for fever. Positive for fatigue Eyes: Negative for visual changes. ENT: Negative for sore throat Cardiovascular: Negative for chest pain. Respiratory: Negative for shortness of breath. Gastrointestinal: Negative for vomiting and diarrhea. Genitourinary: Negative for dysuria. Musculoskeletal: Negative for back pain. Skin: Negative for rash. Neurological: Negative for headaches or focal weakness Psychiatric: No suicidal ideation  10-point ROS otherwise negative.  ____________________________________________   PHYSICAL EXAM:  VITAL SIGNS: ED Triage Vitals  Enc Vitals Group     BP 05/20/15 1425 111/81 mmHg     Pulse Rate 05/20/15 1425 95     Resp 05/20/15 1425 16      Temp 05/20/15 1425 97.7 F (36.5 C)     Temp Source 05/20/15 1425 Oral     SpO2 05/20/15 1425 95 %     Weight 05/20/15 1425 291 lb (131.997 kg)     Height 05/20/15 1425 6' (1.829 m)     Head Cir --      Peak Flow --      Pain Score 05/20/15 1426 4     Pain Loc --      Pain Edu? --      Excl. in GC? --      Constitutional: Alert and oriented. Well appearing and in no distress. Eyes: Conjunctivae are normal.  ENT   Head: Normocephalic and atraumatic.   Mouth/Throat: Mucous membranes are moist. Cardiovascular: Normal rate, regular rhythm. Normal and symmetric distal pulses are present in all extremities. No murmurs, rubs, or gallops. Respiratory: Normal respiratory effort without tachypnea nor retractions. Breath sounds are clear and equal bilaterally.  Gastrointestinal: Soft and non-tender in all quadrants. No distention. There is no CVA tenderness. Genitourinary: deferred Musculoskeletal: Nontender with normal range of motion in all extremities. No lower extremity tenderness nor edema. Neurologic:  Normal speech and language. No gross focal neurologic deficits are appreciated. Skin:  Skin is warm, dry and intact. No rash noted. Healed shallow abrasions to left wrist Psychiatric: Mood and affect are normal. Patient exhibits appropriate insight and judgment.  ____________________________________________    LABS (pertinent positives/negatives)  Labs Reviewed  CBC  COMPREHENSIVE METABOLIC PANEL  LIPASE, BLOOD    ____________________________________________   EKG  None  ____________________________________________    RADIOLOGY I have  personally reviewed any xrays that were ordered on this patient: None  ____________________________________________   PROCEDURES  Procedure(s) performed: none  Critical Care performed: none  ____________________________________________   INITIAL IMPRESSION / ASSESSMENT AND PLAN / ED COURSE  Pertinent labs & imaging  results that were available during my care of the patient were reviewed by me and considered in my medical decision making (see chart for details).  Patient well-appearing and in no distress him a benign exam. We will check labs. Patient denies suicidal ideation and contracts for safety.  Labs are unremarkable. All vitals stable. Benign exam. The patient is okay for discharge he has outpatient follow-up  ____________________________________________   FINAL CLINICAL IMPRESSION(S) / ED DIAGNOSES  Final diagnoses:  Gastritis     Jene Every, MD 05/20/15 1551

## 2015-09-12 IMAGING — CT CT ABD-PELV W/ CM
2 of 4 series · 17 of 46 positions shown, 19 images · IV contrast (agent unspecified)
Comparison: None.Exam dictated in down time status and no
comparison imaging available

CLINICAL DATA: Left lower quadrant abdominal pain and diarrhea

EXAM:
CT ABDOMEN AND PELVIS WITH CONTRAST
TECHNIQUE: Multidetector CT imaging of the abdomen and pelvis was performed
using the standard protocol following bolus administration of
intravenous contrast.
CONTRAST:  Exam dictated in down time status and dose not available.
Reference paperwork.

[Series 2: routine abd pel with · axial · 0.95mm/px · z∈[-496,-16]mm · 14 of 106 slices shown, 16 images]
[im 5/106  soft-tissue]
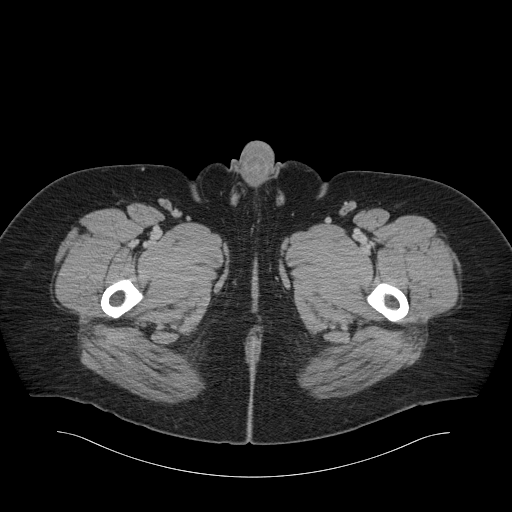
[im 5/106  bone]
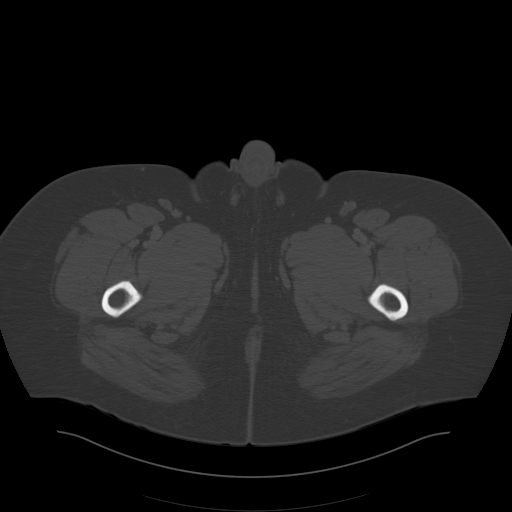
[im 14/106  soft-tissue]
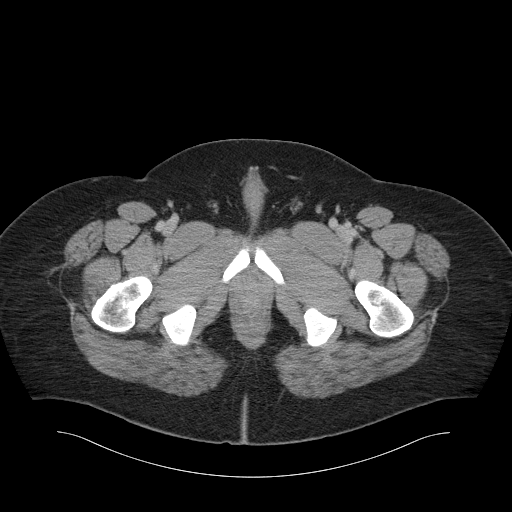
[im 22/106  soft-tissue]
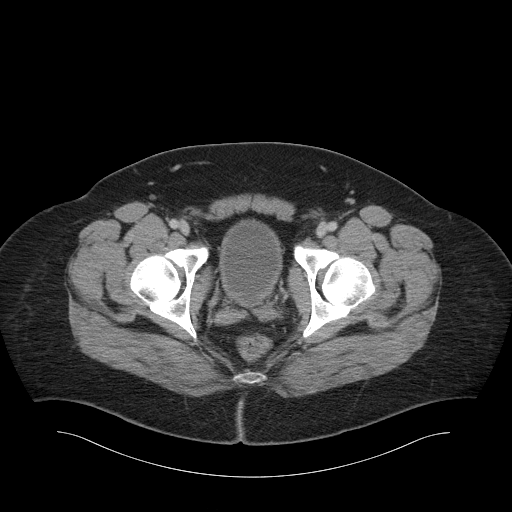
[im 27/106  soft-tissue]
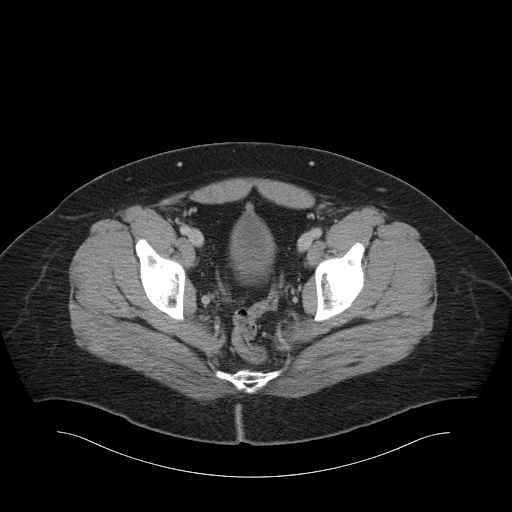
[im 36/106  soft-tissue]
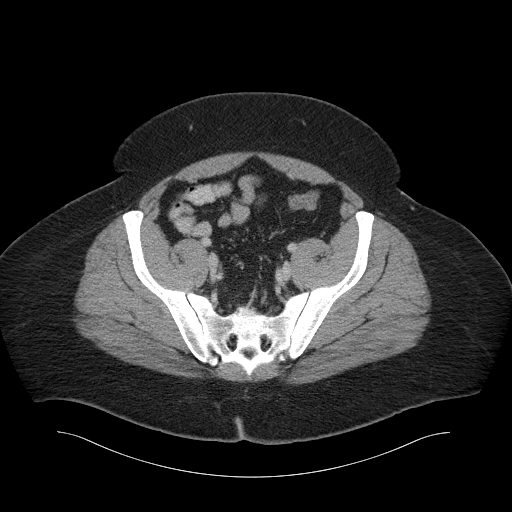
[im 44/106  soft-tissue]
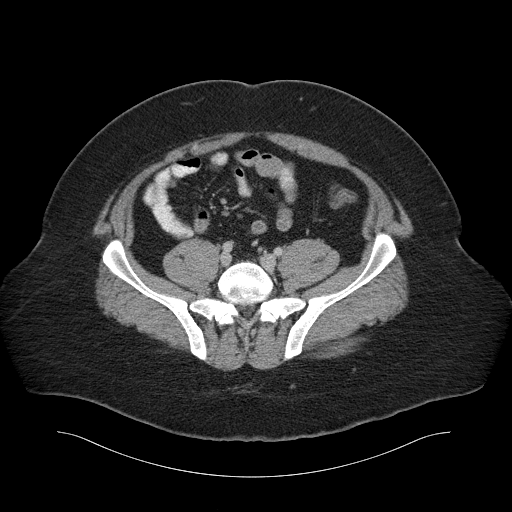
[im 49/106  soft-tissue]
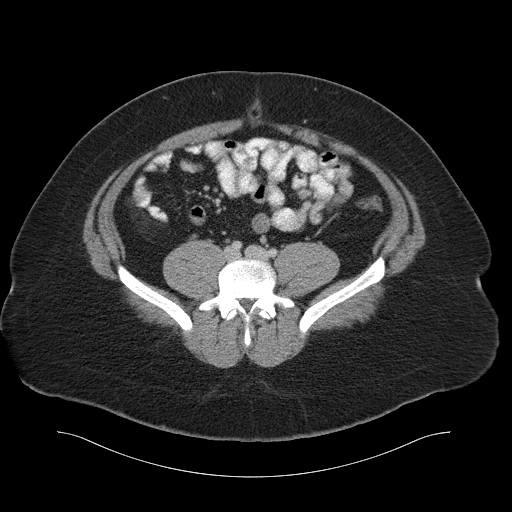
[im 57/106  soft-tissue]
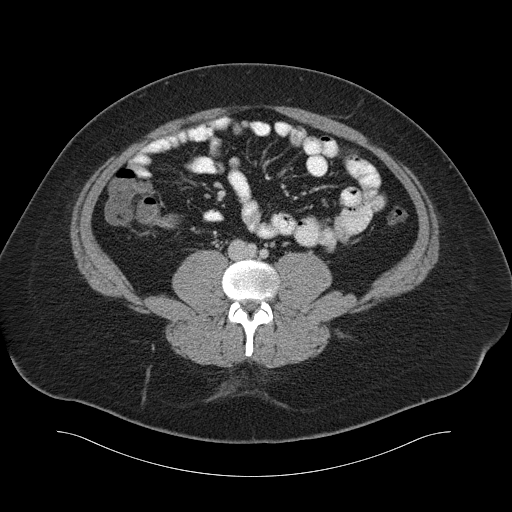
[im 62/106  soft-tissue]
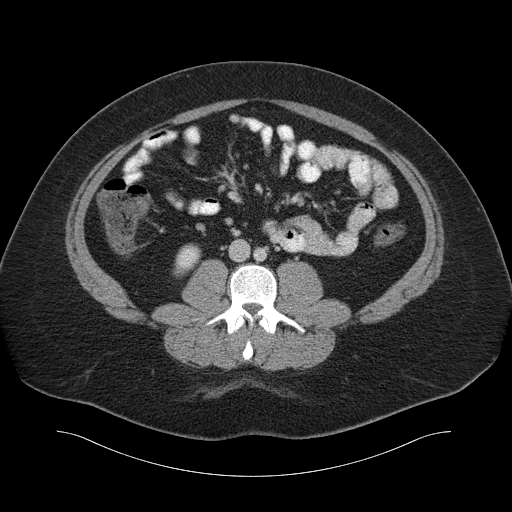
[im 62/106  bone]
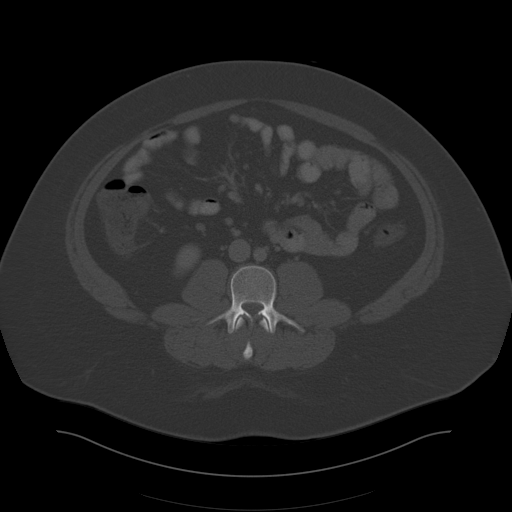
[im 71/106  soft-tissue]
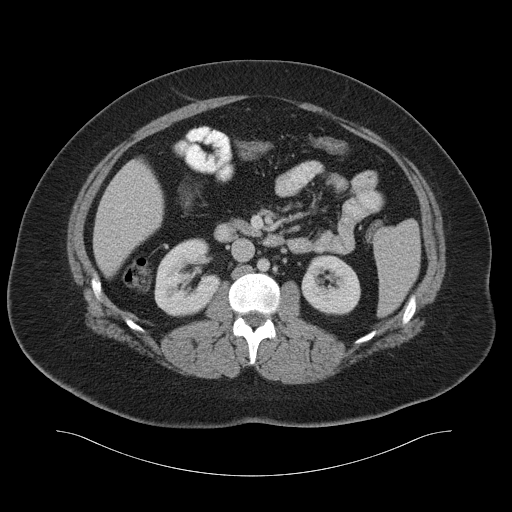
[im 79/106  soft-tissue]
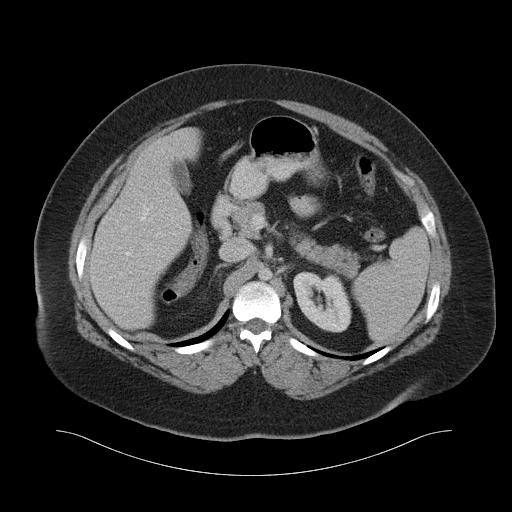
[im 84/106  soft-tissue]
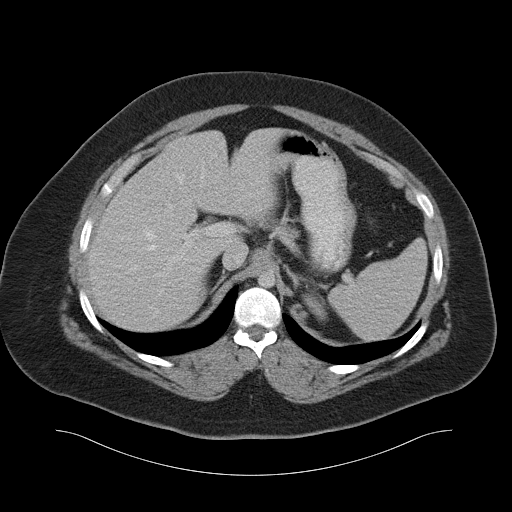
[im 92/106  soft-tissue]
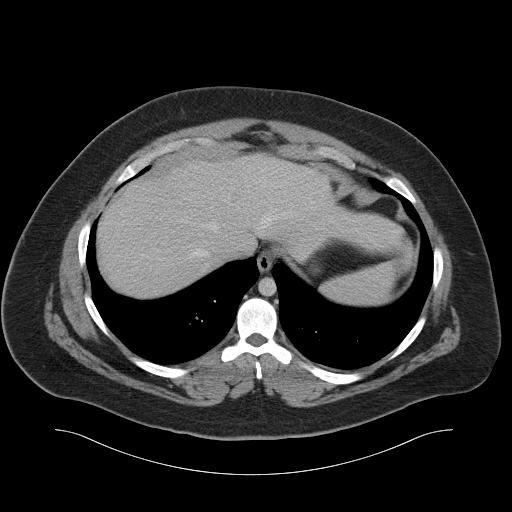
[im 101/106  soft-tissue]
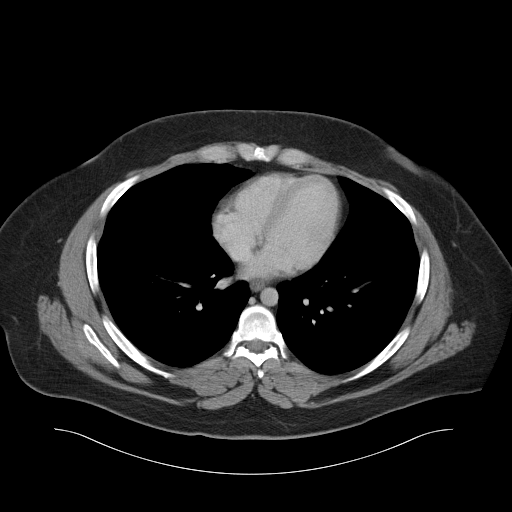

[Series 5: cor routine abd pel with · coronal · 0.96mm/px · 3 of 152 slices shown]
[im 51/152  soft-tissue]
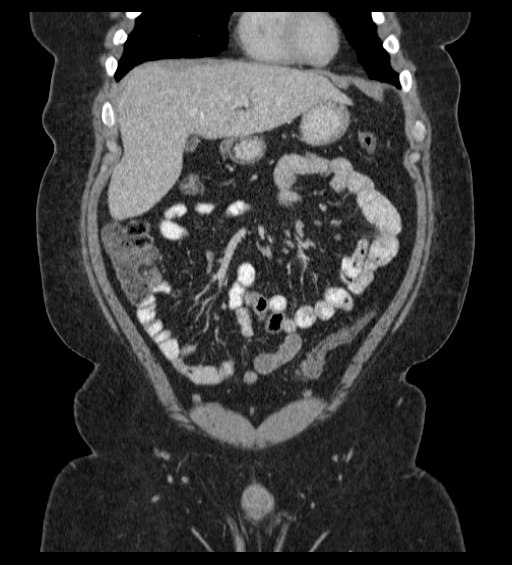
[im 68/152  soft-tissue]
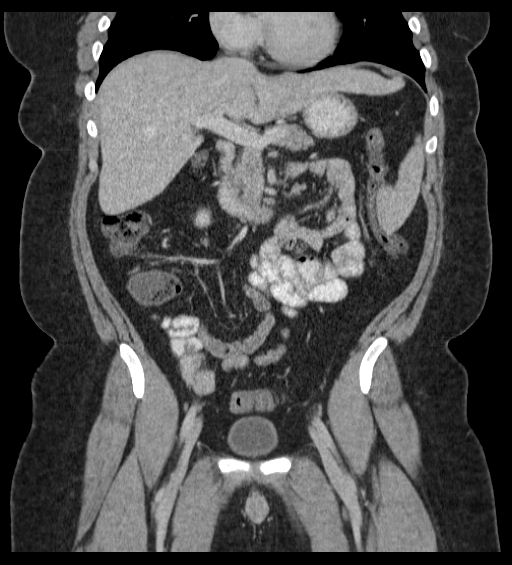
[im 84/152  soft-tissue]
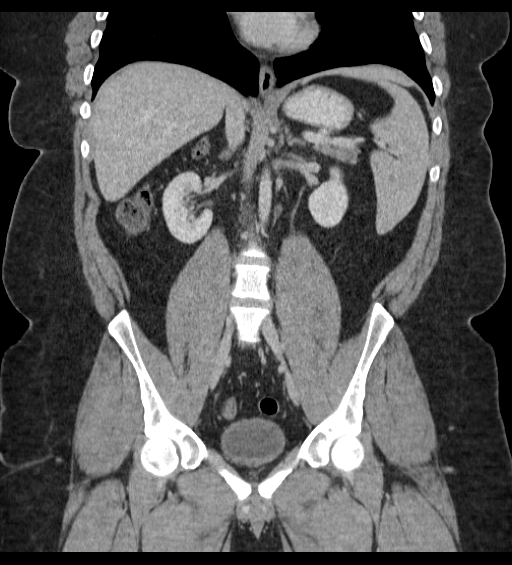

[17 of 46 positions shown; findings below may reference images not displayed]

FINDINGS: BODY WALL: Unremarkable.

LOWER CHEST: Unremarkable.

ABDOMEN/PELVIS:

Liver: No focal abnormality.

Biliary: No evidence of biliary obstruction or stone.

Pancreas: Unremarkable.

Spleen: Unremarkable.

Adrenals: Unremarkable.

Kidneys and ureters: No hydronephrosis or stone.

Bladder: Unremarkable.

Reproductive: Unremarkable.

Bowel: No inflammatory bowel wall thickening or obstruction.
Negative appendix.

Retroperitoneum: No mass or adenopathy.

Peritoneum: No ascites or pneumoperitoneum.

Vascular: No acute abnormality.

OSSEOUS: No acute abnormalities.
IMPRESSION: No findings to explain abdominal pain.

## 2016-04-07 ENCOUNTER — Emergency Department (HOSPITAL_COMMUNITY)
Admission: EM | Admit: 2016-04-07 | Discharge: 2016-04-07 | Disposition: A | Discharge status: Home / Self Care | Payer: Medicaid Other | Attending: Emergency Medicine | Admitting: Emergency Medicine

## 2016-04-07 ENCOUNTER — Encounter (HOSPITAL_COMMUNITY): Payer: Self-pay | Admitting: Emergency Medicine

## 2016-04-07 DIAGNOSIS — G43019 Migraine without aura, intractable, without status migrainosus: Secondary | ICD-10-CM | POA: Insufficient documentation

## 2016-04-07 DIAGNOSIS — G43009 Migraine without aura, not intractable, without status migrainosus: Secondary | ICD-10-CM

## 2016-04-07 MED ORDER — DEXAMETHASONE SODIUM PHOSPHATE 10 MG/ML IJ SOLN
10.0000 mg | Freq: Once | INTRAMUSCULAR | Status: AC
  Administered 2016-04-07: 10 mg via INTRAVENOUS
  Filled 2016-04-07: qty 1

## 2016-04-07 MED ORDER — SODIUM CHLORIDE 0.9 % IV SOLN
1000.0000 mL | Freq: Once | INTRAVENOUS | Status: AC
  Administered 2016-04-07: 1000 mL via INTRAVENOUS

## 2016-04-07 MED ORDER — METOCLOPRAMIDE HCL 5 MG/ML IJ SOLN
10.0000 mg | Freq: Once | INTRAMUSCULAR | Status: AC
  Administered 2016-04-07: 10 mg via INTRAVENOUS
  Filled 2016-04-07: qty 2

## 2016-04-07 MED ORDER — SODIUM CHLORIDE 0.9 % IV SOLN
1000.0000 mL | INTRAVENOUS | Status: DC
  Administered 2016-04-07: 1000 mL via INTRAVENOUS

## 2016-04-07 MED ORDER — METOCLOPRAMIDE HCL 10 MG PO TABS: 10.0000 mg | ORAL_TABLET | Freq: Four times a day (QID) | ORAL | Status: AC | PRN

## 2016-04-07 MED ORDER — DIPHENHYDRAMINE HCL 50 MG/ML IJ SOLN
25.0000 mg | Freq: Once | INTRAMUSCULAR | Status: AC
  Administered 2016-04-07: 25 mg via INTRAVENOUS
  Filled 2016-04-07: qty 1

## 2016-04-07 MED ORDER — KETOROLAC TROMETHAMINE 30 MG/ML IJ SOLN
30.0000 mg | Freq: Once | INTRAMUSCULAR | Status: AC
  Administered 2016-04-07: 30 mg via INTRAVENOUS
  Filled 2016-04-07: qty 1

## 2016-04-07 NOTE — ED Provider Notes (Signed)
CSN: [id number]     Arrival date & time 04/07/16  0200 History  By signing my name below, I, [facility], attest that this documentation has been prepared under the direction and in the presence of Dione Boozeavid Kenneith Stief, MD. Electronically Signed: Randell PatientMarrissa Miranda, ED Scribe. 04/07/2016. 3:12 AM.   Chief Complaint  Patient presents with  . Headache   The history is provided by the patient. No language interpreter was used.  HPI Comments: Roger Miranda is a 25 y.o. male brought in by EMS with an hx of depression who presents to the Emergency Department complaining of constant, bilateral frontal, gradually worsening HA. Pt states that his HA began as 3/10 in severity that has since progressed to a 6/10 in severity. He reports blurred vision while walking once earlier tonight (resolved), nausea once en route to the ED (resolved), mild photophobia, and mild sensitivity to sounds. Denies similar HAs in the past. Denies having a PCP currently. Denies fever, chills, neck stiffness, or any other symptoms currently.   Past Medical History  Diagnosis Date  . Depression   . Suicidal thoughts    History reviewed. No pertinent past surgical history. History reviewed. No pertinent family history. Social History  Substance Use Topics  . Smoking status: Never Smoker   . Smokeless tobacco: Never Used  . Alcohol Use: Yes     Comment: socially    Review of Systems  Constitutional: Negative for fever and chills.  Eyes: Positive for photophobia. Negative for visual disturbance (resolved).  Gastrointestinal: Negative for nausea (resolved).  Musculoskeletal: Negative for neck stiffness.  Neurological: Positive for headaches.  All other systems reviewed and are negative.     Allergies  Review of patient's allergies indicates no known allergies.  Home Medications   Prior to Admission medications   Not on File   BP 145/91 mmHg  Pulse 91  Temp(Src) 98.5 F (36.9 C) (Oral)  Resp 18  SpO2  99% Physical Exam  Constitutional: He is oriented to person, place, and time. He appears well-developed and well-nourished. No distress.  HENT:  Head: Normocephalic and atraumatic.  Eyes: Conjunctivae are normal. Pupils are equal, round, and reactive to light.  Mild photophobia. Fundi normal.  Neck: Normal range of motion. Neck supple. No JVD present.  Cardiovascular: Normal rate, regular rhythm and normal heart sounds.   No murmur heard. Pulmonary/Chest: Effort normal and breath sounds normal. He has no wheezes. He has no rales. He exhibits no tenderness.  Abdominal: Soft. He exhibits no distension and no mass. There is no tenderness.  Bowel sounds decreased.  Musculoskeletal: Normal range of motion. He exhibits no edema.  Lymphadenopathy:    He has no cervical adenopathy.  Neurological: He is alert and oriented to person, place, and time. No cranial nerve deficit. He exhibits normal muscle tone. Coordination normal.  Skin: Skin is warm and dry. No rash noted.  Psychiatric: He has a normal mood and affect. His behavior is normal. Judgment and thought content normal.  Nursing note and vitals reviewed.   ED Course  Procedures  DIAGNOSTIC STUDIES: Oxygen Saturation is 99% on RA, normal by my interpretation.    COORDINATION OF CARE: 3:03 AM Will order Reglan, Benadryl, and Toradol. Discussed treatment plan with pt at bedside and pt agreed to plan.    MDM   Final diagnoses:  Migraine without aura and without status migrainosus, not intractable    Headache with pattern strongly suggesting migraine. No prior history of migraines, but he is in the  age range where migraines are expected to have an initial presentation. No red flags to suggest more serious causes of headaches. Old records are reviewed and he has no relevant past visits. He will be given a headache cocktail of normal saline, metoclopramide, diphenhydramine and reassessed.  He had partial relief of headache with above  noted treatment. He was given a dose of ketorolac and dexamethasone with complete relief of headache. He is discharged with prescription for metoclopramide and told to use over-the-counter NSAIDs as needed.  I personally performed the services described in this documentation, which was scribed in my presence. The recorded information has been reviewed and is accurate.      Dione Booze, MD 04/07/16 [id number]

## 2016-04-07 NOTE — ED Notes (Signed)
Bed: WA03 Expected date:  Expected time:  Means of arrival:  Comments: 

## 2016-04-07 NOTE — ED Notes (Signed)
Bed: WA02 Expected date:  Expected time:  Means of arrival:  Comments: 

## 2016-04-07 NOTE — ED Notes (Signed)
Pt states that he has had headaches x 2 hours. EMS reports patient was not orthostatic. Neuro intact. Alert and oriented. Migraines as a child.

## 2016-04-07 NOTE — Discharge Instructions (Signed)
Take ibuprofen or naproxen as needed. Take acetaminophen as needed.  Migraine Headache A migraine headache is an intense, throbbing pain on one or both sides of your head. A migraine can last for 30 minutes to several hours. CAUSES  The exact cause of a migraine headache is not always known. However, a migraine may be caused when nerves in the brain become irritated and release chemicals that cause inflammation. This causes pain. Certain things may also trigger migraines, such as:  Alcohol.  Smoking.  Stress.  Menstruation.  Aged cheeses.  Foods or drinks that contain nitrates, glutamate, aspartame, or tyramine.  Lack of sleep.  Chocolate.  Caffeine.  Hunger.  Physical exertion.  Fatigue.  Medicines used to treat chest pain (nitroglycerine), birth control pills, estrogen, and some blood pressure medicines. SIGNS AND SYMPTOMS  Pain on one or both sides of your head.  Pulsating or throbbing pain.  Severe pain that prevents daily activities.  Pain that is aggravated by any physical activity.  Nausea, vomiting, or both.  Dizziness.  Pain with exposure to bright lights, loud noises, or activity.  General sensitivity to bright lights, loud noises, or smells. Before you get a migraine, you may get warning signs that a migraine is coming (aura). An aura may include:  Seeing flashing lights.  Seeing bright spots, halos, or zigzag lines.  Having tunnel vision or blurred vision.  Having feelings of numbness or tingling.  Having trouble talking.  Having muscle weakness. DIAGNOSIS  A migraine headache is often diagnosed based on:  Symptoms.  Physical exam.  A CT scan or MRI of your head. These imaging tests cannot diagnose migraines, but they can help rule out other causes of headaches. TREATMENT Medicines may be given for pain and nausea. Medicines can also be given to help prevent recurrent migraines.  HOME CARE INSTRUCTIONS  Only take over-the-counter  or prescription medicines for pain or discomfort as directed by your health care provider. The use of long-term narcotics is not recommended.  Lie down in a dark, quiet room when you have a migraine.  Keep a journal to find out what may trigger your migraine headaches. For example, write down:  What you eat and drink.  How much sleep you get.  Any change to your diet or medicines.  Limit alcohol consumption.  Quit smoking if you smoke.  Get 7-9 hours of sleep, or as recommended by your health care provider.  Limit stress.  Keep lights dim if bright lights bother you and make your migraines worse. SEEK IMMEDIATE MEDICAL CARE IF:   Your migraine becomes severe.  You have a fever.  You have a stiff neck.  You have vision loss.  You have muscular weakness or loss of muscle control.  You start losing your balance or have trouble walking.  You feel faint or pass out.  You have severe symptoms that are different from your first symptoms. MAKE SURE YOU:   Understand these instructions.  Will watch your condition.  Will get help right away if you are not doing well or get worse.   This information is not intended to replace advice given to you by your health care provider. Make sure you discuss any questions you have with your health care provider.   Document Released: 10/26/2005 Document Revised: 11/16/2014 Document Reviewed: 07/03/2013 Elsevier Interactive Patient Education 2016 [employer]  Metoclopramide tablets What is this medicine? METOCLOPRAMIDE (met oh kloe PRA mide) is used to treat the symptoms of gastroesophageal reflux disease (GERD)  like heartburn. It is also used to treat people with slow emptying of the stomach and intestinal tract. This medicine may be used for other purposes; ask your health care provider or pharmacist if you have questions. What should I tell my health care provider before I take this medicine? They need to know if you have any of  these conditions: -breast cancer -depression -diabetes -heart failure -high blood pressure -kidney disease -liver disease -Parkinson's disease or a movement disorder -pheochromocytoma -seizures -stomach obstruction, bleeding, or perforation -an unusual or allergic reaction to metoclopramide, procainamide, sulfites, other medicines, foods, dyes, or preservatives -pregnant or trying to get pregnant -breast-feeding How should I use this medicine? Take this medicine by mouth with a glass of water. Follow the directions on the prescription label. Take this medicine on an empty stomach, about 30 minutes before eating. Take your doses at regular intervals. Do not take your medicine more often than directed. Do not stop taking except on the advice of your doctor or health care professional. A special MedGuide will be given to you by the pharmacist with each prescription and refill. Be sure to read this information carefully each time. Talk to your pediatrician regarding the use of this medicine in children. Special care may be needed. Overdosage: If you think you have taken too much of this medicine contact a poison control center or emergency room at once. NOTE: This medicine is only for you. Do not share this medicine with others. What if I miss a dose? If you miss a dose, take it as soon as you can. If it is almost time for your next dose, take only that dose. Do not take double or extra doses. What may interact with this medicine? -acetaminophen -cyclosporine -digoxin -medicines for blood pressure -medicines for diabetes, including insulin -medicines for hay fever and other allergies -medicines for depression, especially an Monoamine Oxidase Inhibitor (MAOI) -medicines for Parkinson's disease, like levodopa -medicines for sleep or for pain -tetracycline This list may not describe all possible interactions. Give your health care provider a list of all the medicines, herbs,  non-prescription drugs, or dietary supplements you use. Also tell them if you smoke, drink alcohol, or use illegal drugs. Some items may interact with your medicine. What should I watch for while using this medicine? It may take a few weeks for your stomach condition to start to get better. However, do not take this medicine for longer than 12 weeks. The longer you take this medicine, and the more you take it, the greater your chances are of developing serious side effects. If you are an elderly patient, a male patient, or you have diabetes, you may be at an increased risk for side effects from this medicine. Contact your doctor immediately if you start having movements you cannot control such as lip smacking, rapid movements of the tongue, involuntary or uncontrollable movements of the eyes, head, arms and legs, or muscle twitches and spasms. Patients and their families should watch out for worsening depression or thoughts of suicide. Also watch out for any sudden or severe changes in feelings such as feeling anxious, agitated, panicky, irritable, hostile, aggressive, impulsive, severely restless, overly excited and hyperactive, or not being able to sleep. If this happens, especially at the beginning of treatment or after a change in dose, call your doctor. Do not treat yourself for high fever. Ask your doctor or health care professional for advice. You may get drowsy or dizzy. Do not drive, use machinery, or do anything  that needs mental alertness until you know how this drug affects you. Do not stand or sit up quickly, especially if you are an older patient. This reduces the risk of dizzy or fainting spells. Alcohol can make you more drowsy and dizzy. Avoid alcoholic drinks. What side effects may I notice from receiving this medicine? Side effects that you should report to your doctor or health care professional as soon as possible: -allergic reactions like skin rash, itching or hives, swelling of the  face, lips, or tongue -abnormal production of milk in females -breast enlargement in both males and females -change in the way you walk -difficulty moving, speaking or swallowing -drooling, lip smacking, or rapid movements of the tongue -excessive sweating -fever -involuntary or uncontrollable movements of the eyes, head, arms and legs -irregular heartbeat or palpitations -muscle twitches and spasms -unusually weak or tired Side effects that usually do not require medical attention (report to your doctor or health care professional if they continue or are bothersome): -change in sex drive or performance -depressed mood -diarrhea -difficulty sleeping -headache -menstrual changes -restless or nervous This list may not describe all possible side effects. Call your doctor for medical advice about side effects. You may report side effects to FDA at 1-800-FDA-1088. Where should I keep my medicine? Keep out of the reach of children. Store at room temperature between 20 and 25 degrees C (68 and 77 degrees F). Protect from light. Keep container tightly closed. Throw away any unused medicine after the expiration date. NOTE: This sheet is a summary. It may not cover all possible information. If you have questions about this medicine, talk to your doctor, pharmacist, or health care provider.    2016, Elsevier/Gold Standard. (2012-02-23 13:04:38)

## 2016-12-02 ENCOUNTER — Emergency Department
Admission: EM | Admit: 2016-12-02 | Discharge: 2016-12-03 | Disposition: A | Discharge status: Home / Self Care | Payer: Medicaid Other | Attending: Emergency Medicine | Admitting: Emergency Medicine

## 2016-12-02 ENCOUNTER — Emergency Department: Payer: Medicaid Other

## 2016-12-02 ENCOUNTER — Encounter: Payer: Self-pay | Admitting: Emergency Medicine

## 2016-12-02 DIAGNOSIS — M109 Gout, unspecified: Secondary | ICD-10-CM

## 2016-12-02 DIAGNOSIS — Z79899 Other long term (current) drug therapy: Secondary | ICD-10-CM | POA: Insufficient documentation

## 2016-12-02 DIAGNOSIS — M10071 Idiopathic gout, right ankle and foot: Secondary | ICD-10-CM | POA: Insufficient documentation

## 2016-12-02 NOTE — ED Triage Notes (Signed)
Patient brought in by ems with complaint of left foot pain. Patient denies any injury that he recalls. Patient sttes taht he ambulates 2 hours to work and that he stands a lot at work.

## 2016-12-02 NOTE — ED Triage Notes (Signed)
Patient ambulatory to triage with steady gait, without difficulty or distress noted, brought in by EMS; pt  Reports right ankle pain x week with no known injury;  Reports has noted some swelling to ankle

## 2016-12-03 MED ORDER — INDOMETHACIN 50 MG PO CAPS: 50.0000 mg | ORAL_CAPSULE | Freq: Three times a day (TID) | ORAL | 0 refills | Status: AC | PRN

## 2016-12-03 MED ORDER — COLCHICINE 0.6 MG PO TABS
0.6000 mg | ORAL_TABLET | Freq: Once | ORAL | Status: AC
  Administered 2016-12-03: 0.6 mg via ORAL

## 2016-12-03 MED ORDER — COLCHICINE 0.6 MG PO TABS
ORAL_TABLET | ORAL | Status: AC
  Filled 2016-12-03: qty 1

## 2016-12-03 NOTE — ED Provider Notes (Signed)
[facility] Emergency Department Provider Note    First MD Initiated Contact with Patient 12/02/16 2355     (approximate)  I have reviewed the triage vital signs and the nursing notes.   HISTORY  Chief Complaint Ankle Pain   HPI Roger Miranda is a 26 y.o. male presents with nontraumatic right ankle pain times approximately one week. Patient states the pain is worse with movement of the ankle. Patient denies any fever.   Past Medical History:  Diagnosis Date  . Depression   . Suicidal thoughts     There are no active problems to display for this patient.   Past Surgical History None  Prior to Admission medications   Medication Sig Start Date End Date Taking? Authorizing Provider  indomethacin (INDOCIN) 50 MG capsule Take 1 capsule (50 mg total) by mouth 3 (three) times daily as needed. 12/03/16   Darci Currentandolph N Brown, MD  metoCLOPramide (REGLAN) 10 MG tablet Take 1 tablet (10 mg total) by mouth every 6 (six) hours as needed for nausea (or headache). 04/07/16   Dione Boozeavid Glick, MD    Allergies No Known Allergies No family history on file.  Social History Social History  Substance Use Topics  . Smoking status: Never Smoker  . Smokeless tobacco: Never Used  . Alcohol use Yes     Comment: socially    Review of Systems Constitutional: No fever/chills Eyes: No visual changes. ENT: No sore throat. Cardiovascular: Denies chest pain. Respiratory: Denies shortness of breath. Gastrointestinal: No abdominal pain.  No nausea, no vomiting.  No diarrhea.  No constipation. Genitourinary: Negative for dysuria. Musculoskeletal: Negative for back pain.Positive for right ankle pain Skin: Negative for rash. Neurological: Negative for headaches, focal weakness or numbness.  10-point ROS otherwise negative.  ____________________________________________   PHYSICAL EXAM:  VITAL SIGNS: ED Triage Vitals  Enc Vitals Group     BP 12/02/16 2211 (!) 144/87   Pulse Rate 12/02/16 2211 (!) 103     Resp 12/02/16 2211 18     Temp 12/02/16 2211 98.6 F (37 C)     Temp Source 12/02/16 2211 Oral     SpO2 12/02/16 2211 96 %     Weight 12/02/16 2210 250 lb (113.4 kg)     Height 12/02/16 2210 6' (1.829 m)     Head Circumference --      Peak Flow --      Pain Score 12/02/16 2216 4     Pain Loc --      Pain Edu? --      Excl. in GC? --     Constitutional: Alert and oriented. Well appearing and in no acute distress. Eyes: Conjunctivae are normal. PERRL. EOMI. Head: Atraumatic. Ears:  Healthy appearing ear canals and TMs bilaterally Nose: No congestion/rhinnorhea. Mouth/Throat: Mucous membranes are moist.  Oropharynx non-erythematous. Neck: No stridor.   Cardiovascular: Normal rate, regular rhythm. Good peripheral circulation. Grossly normal heart sounds. Respiratory: Normal respiratory effort.  No retractions. Lungs CTAB. Gastrointestinal: Soft and nontender. No distention.  Musculoskeletal:Right medial Mallleoli pain swelling and warmth  Neurologic:  Normal speech and language. No gross focal neurologic deficits are appreciated.  Skin:  Skin is warm, dry and intact. No rash noted.     RADIOLOGY I, Gene Autry N BROWN, personally viewed and evaluated these images (plain radiographs) as part of my medical decision making, as well as reviewing the written report by the radiologist.  Dg Ankle Complete Right  Result Date: 12/02/2016 CLINICAL DATA:  Ill LEFT ankle pain for 1 week. EXAM: RIGHT ANKLE - COMPLETE 3+ VIEW COMPARISON:  RIGHT foot radiograph October 18, 2011 FINDINGS: No fracture deformity nor dislocation. The ankle mortise appears congruent and the tibiofibular syndesmosis intact. No destructive bony lesions. Soft tissue swelling without subcutaneous gas or radiopaque foreign bodies. IMPRESSION: Soft tissue swelling, no acute osseous process. Electronically Signed   By: Awilda Metro M.D.   On: 12/02/2016 22:43      Procedures  __   INITIAL IMPRESSION / ASSESSMENT AND PLAN / ED COURSE  Pertinent labs & imaging results that were available during my care of the patient were reviewed by me and considered in my medical decision making (see chart for details).  History and physical exam consistent with Gout. Patient given colchicine      ____________________________________________  FINAL CLINICAL IMPRESSION(S) / ED DIAGNOSES  Final diagnoses:  Acute gout of right ankle, unspecified cause     MEDICATIONS GIVEN DURING THIS VISIT:  Medications - No data to display   NEW OUTPATIENT MEDICATIONS STARTED DURING THIS VISIT:  New Prescriptions   INDOMETHACIN (INDOCIN) 50 MG CAPSULE    Take 1 capsule (50 mg total) by mouth 3 (three) times daily as needed.    Modified Medications   No medications on file    Discontinued Medications   No medications on file     Note:  This document was prepared using Dragon voice recognition software and may include unintentional dictation errors.    Darci Current, MD 12/03/16 0040
